# Patient Record
Sex: Female | Born: 1985 | Race: White | Hispanic: No | Marital: Single
Health system: Southern US, Community
[De-identification: ages and names within clinical notes are randomized; demographics above are authoritative.]

---

## 2008-12-28 ENCOUNTER — Emergency Department (HOSPITAL_BASED_OUTPATIENT_CLINIC_OR_DEPARTMENT_OTHER): Admission: EM | Admit: 2008-12-28 | Discharge: 2008-12-28 | Payer: Self-pay | Admitting: Emergency Medicine

## 2008-12-30 ENCOUNTER — Emergency Department (HOSPITAL_BASED_OUTPATIENT_CLINIC_OR_DEPARTMENT_OTHER): Admission: EM | Admit: 2008-12-30 | Discharge: 2008-12-30 | Payer: Self-pay | Admitting: Emergency Medicine

## 2009-01-19 ENCOUNTER — Ambulatory Visit (HOSPITAL_COMMUNITY): Admission: RE | Admit: 2009-01-19 | Discharge: 2009-01-19 | Payer: Self-pay | Admitting: Internal Medicine

## 2009-05-30 ENCOUNTER — Emergency Department (HOSPITAL_BASED_OUTPATIENT_CLINIC_OR_DEPARTMENT_OTHER): Admission: EM | Admit: 2009-05-30 | Discharge: 2009-05-30 | Payer: Self-pay | Admitting: Emergency Medicine

## 2010-07-26 IMAGING — CT CT ABDOMEN W/ CM
2 of 7 series · 15 of 46 positions shown, 19 images · IV contrast (APPLIED)
Comparison: None

CT ABDOMEN

CLINICAL DATA: Right upper abdominal pain, nausea, vomiting and
diarrhea.

CT ABDOMEN AND PELVIS WITH CONTRAST
TECHNIQUE: Multidetector CT imaging of the abdomen and pelvis was
performed using the standard protocol following bolus
administration of intravenous contrast.
Contrast: 100 ml Wmnipaque-1LL IV

[Series 2: abd/pelv with 5.0 b31f st · axial · 0.74mm/px · z∈[+872,+1312]mm · 12 of 103 slices shown, 16 images]
[im 10/103  soft-tissue]
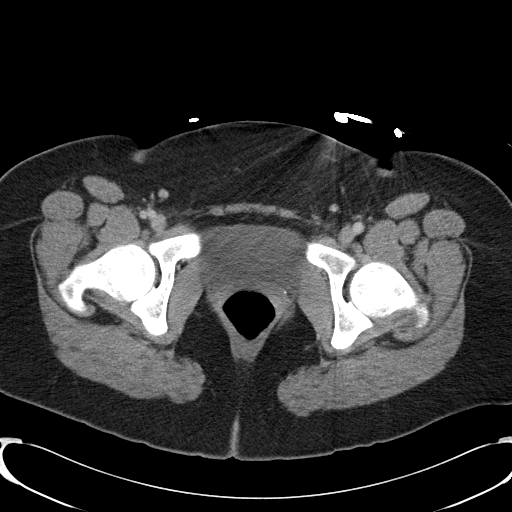
[im 10/103  bone]
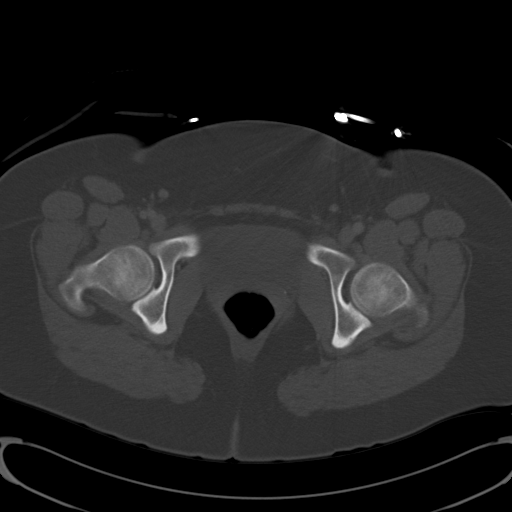
[im 19/103  soft-tissue]
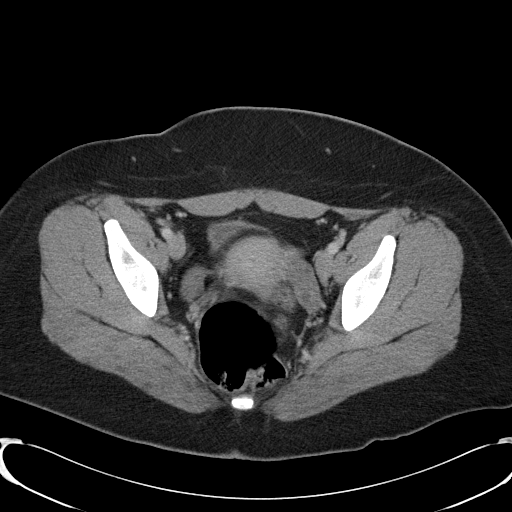
[im 28/103  soft-tissue]
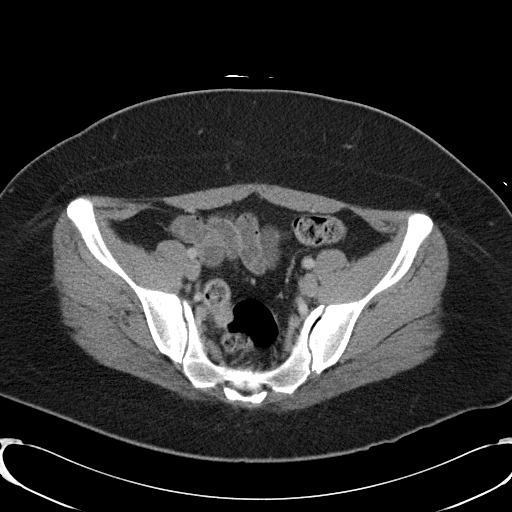
[im 38/103  soft-tissue]
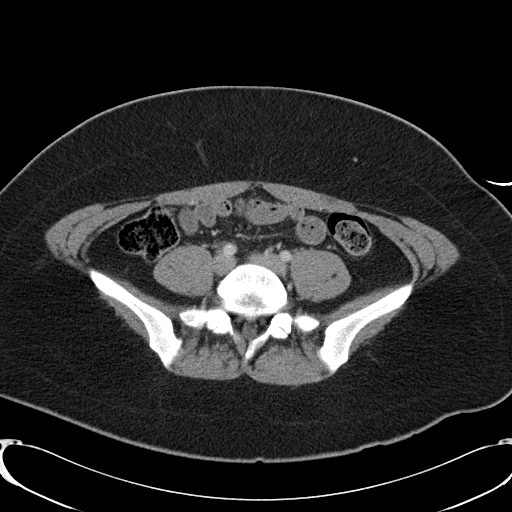
[im 47/103  soft-tissue]
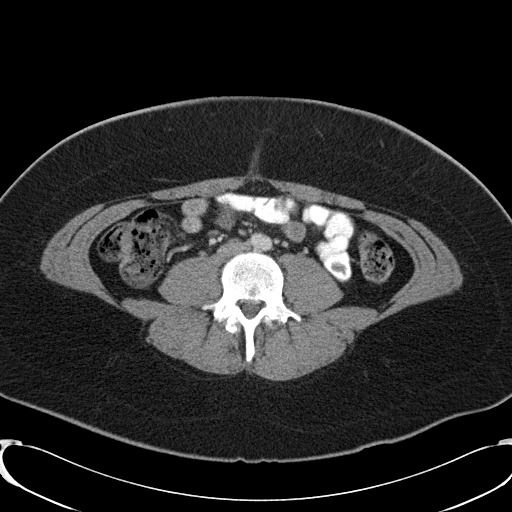
[im 56/103  soft-tissue]
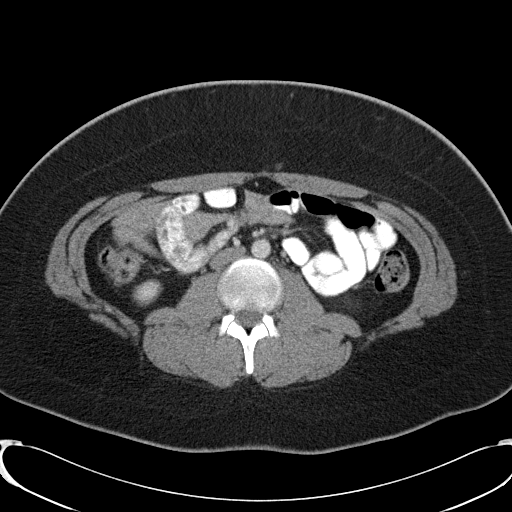
[im 65/103  soft-tissue]
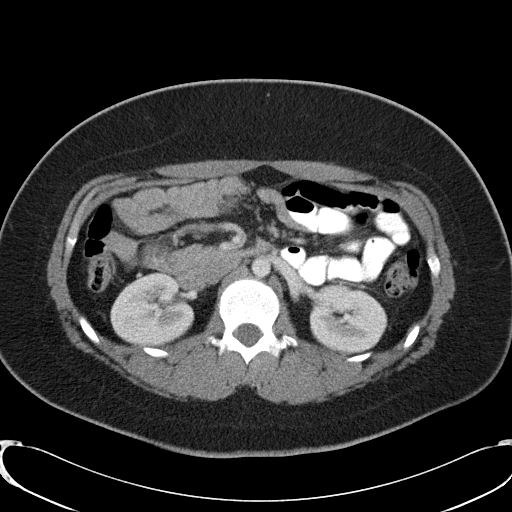
[im 75/103  soft-tissue]
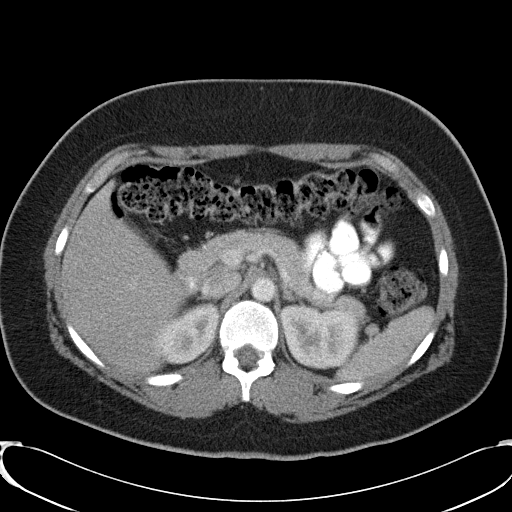
[im 84/103  soft-tissue]
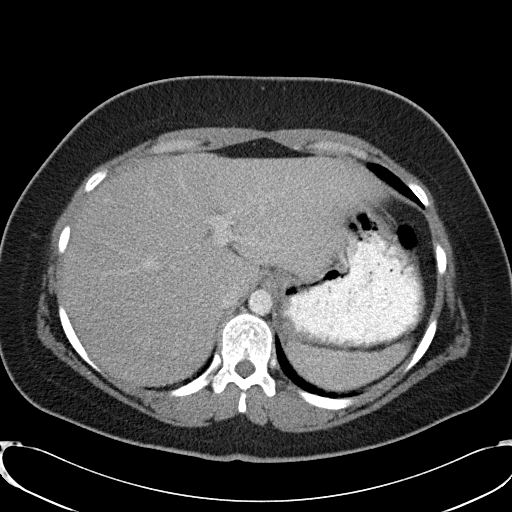
[im 84/103  lung]
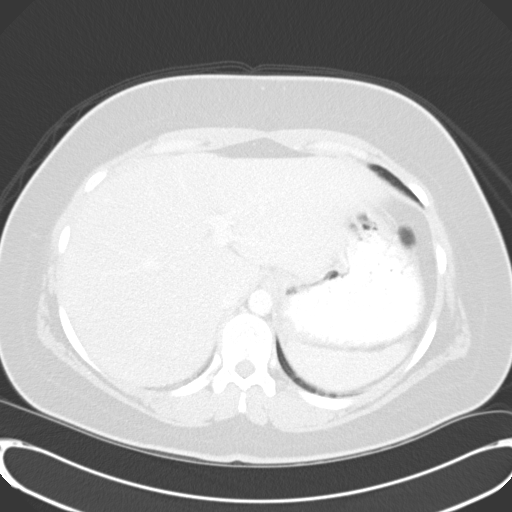
[im 84/103  bone]
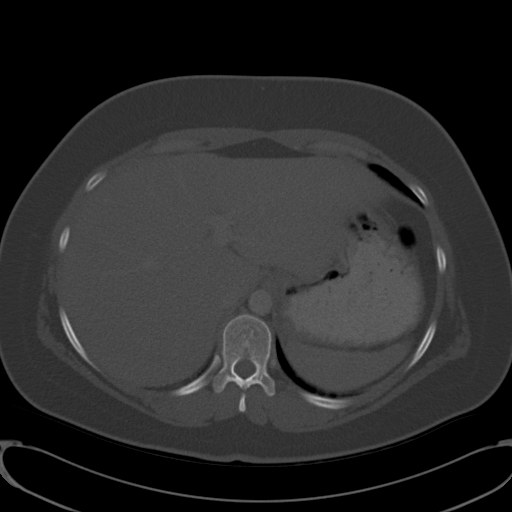
[im 89/103  lung]
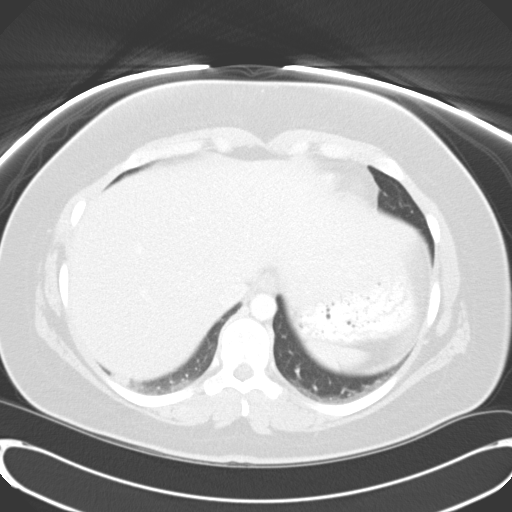
[im 93/103  soft-tissue]
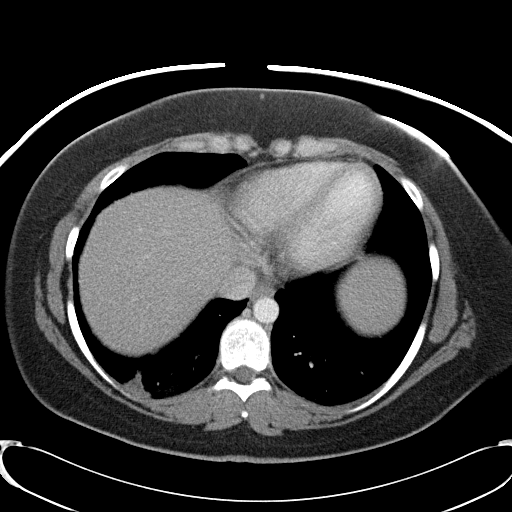
[im 93/103  lung]
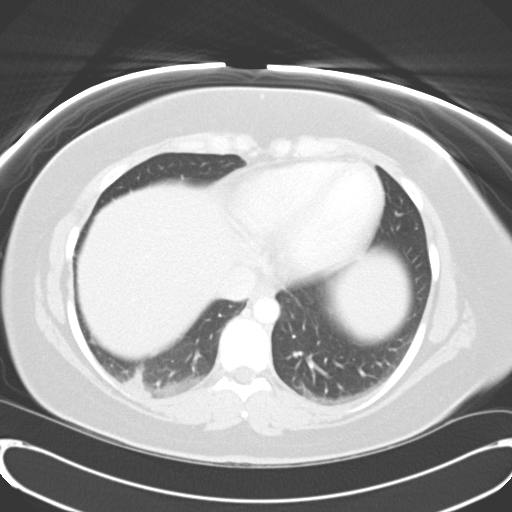
[im 98/103  lung]
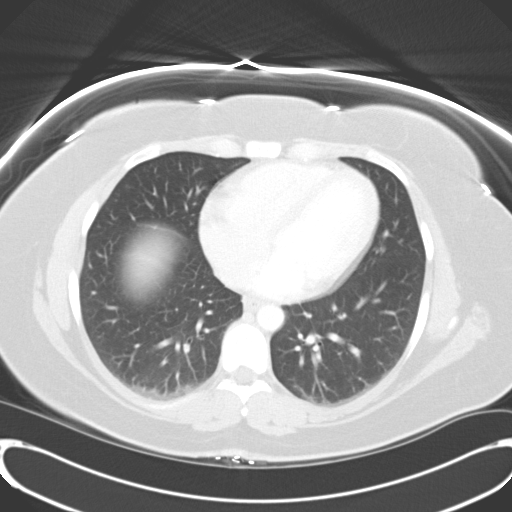

[Series 602: coronal · coronal · 1.00mm/px · 3 of 103 slices shown]
[im 26/103  soft-tissue]
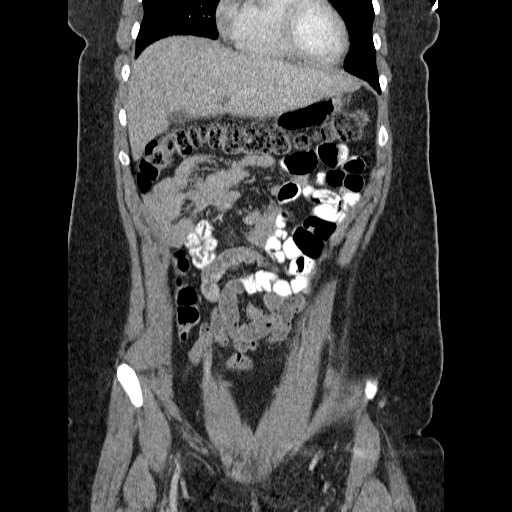
[im 52/103  soft-tissue]
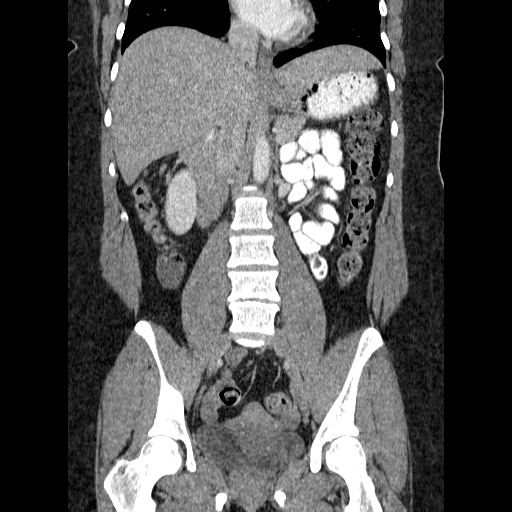
[im 77/103  soft-tissue]
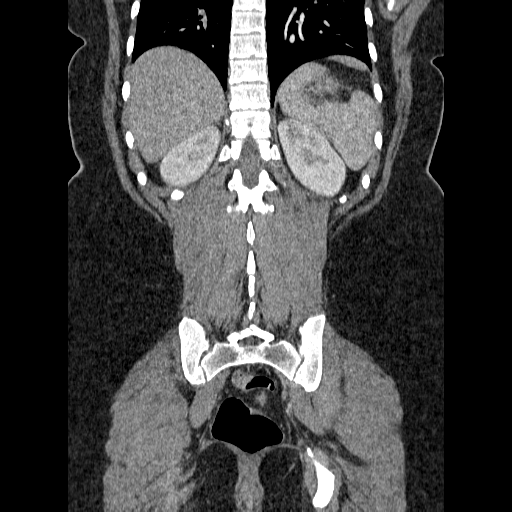

[15 of 46 positions shown; findings below may reference images not displayed]

FINDINGS: The visualized lung bases demonstrate mild atelectasis
at the posterior right lung base.  The liver, gallbladder,
pancreas, spleen, adrenal glands and kidneys are all within normal
limits.

Abdominal bowel loops are normal caliber.  No evidence of
inflammatory process or fluid collection.  No hernias.  No abnormal
calcifications.  The bony structures are unremarkable.
IMPRESSION: Normal CT of abdomen.

CT PELVIS
FINDINGS: Pelvic bowel loops are normal caliber.  The bladder,
uterus, and adnexal regions are unremarkable by CT.  No hernias.
No abnormal calcifications.  The bony pelvis is unremarkable.
IMPRESSION: Normal CT of pelvis.

## 2011-02-16 LAB — URINALYSIS, ROUTINE W REFLEX MICROSCOPIC
Bilirubin Urine: NEGATIVE
Hgb urine dipstick: NEGATIVE
Specific Gravity, Urine: 1.008 (ref 1.005–1.030)
pH: 6 (ref 5.0–8.0)

## 2011-02-16 LAB — POCT TOXICOLOGY PANEL: Amphetamines: POSITIVE

## 2011-02-25 LAB — COMPREHENSIVE METABOLIC PANEL
AST: 30 U/L (ref 0–37)
AST: 33 U/L (ref 0–37)
Albumin: 4.1 g/dL (ref 3.5–5.2)
BUN: 11 mg/dL (ref 6–23)
BUN: 8 mg/dL (ref 6–23)
CO2: 25 mEq/L (ref 19–32)
CO2: 26 mEq/L (ref 19–32)
Calcium: 9.2 mg/dL (ref 8.4–10.5)
Creatinine, Ser: 0.7 mg/dL (ref 0.4–1.2)
GFR calc Af Amer: >60 mL/min (ref >60)
GFR calc non Af Amer: >60 mL/min (ref >60)
Glucose, Bld: 96 mg/dL (ref 70–99)
Total Bilirubin: 0.9 mg/dL (ref 0.3–1.2)

## 2011-02-25 LAB — URINALYSIS, ROUTINE W REFLEX MICROSCOPIC
Bilirubin Urine: NEGATIVE
Hgb urine dipstick: NEGATIVE
Ketones, ur: 40 mg/dL — AB
Leukocytes, UA: NEGATIVE
Nitrite: NEGATIVE
Nitrite: NEGATIVE
Protein, ur: 30 mg/dL — AB
Specific Gravity, Urine: 1.012 (ref 1.005–1.030)
Urobilinogen, UA: 0.2 mg/dL (ref 0.0–1.0)
Urobilinogen, UA: 0.2 mg/dL (ref 0.0–1.0)
pH: 7.5 (ref 5.0–8.0)

## 2011-02-25 LAB — DIFFERENTIAL
Basophils Absolute: 0.1 10*3/uL (ref 0.0–0.1)
Eosinophils Relative: 1 % (ref 0–5)
Lymphocytes Relative: 24 % (ref 12–46)
Monocytes Absolute: 0.6 10*3/uL (ref 0.1–1.0)

## 2011-02-25 LAB — CBC
HCT: 41.6 % (ref 36.0–46.0)
Hemoglobin: 14 g/dL (ref 12.0–15.0)
MCHC: 33.7 g/dL (ref 30.0–36.0)
MCHC: 33.8 g/dL (ref 30.0–36.0)
MCV: 91.4 fL (ref 78.0–100.0)
MCV: 92.5 fL (ref 78.0–100.0)
Platelets: 332 10*3/uL (ref 150–400)
RBC: 4.55 MIL/uL (ref 3.87–5.11)
RBC: 4.91 MIL/uL (ref 3.87–5.11)
RDW: 12.7 % (ref 11.5–15.5)

## 2011-02-25 LAB — PREGNANCY, URINE: Preg Test, Ur: NEGATIVE

## 2015-08-13 ENCOUNTER — Ambulatory Visit (INDEPENDENT_AMBULATORY_CARE_PROVIDER_SITE_OTHER): Payer: BLUE CROSS/BLUE SHIELD | Admitting: Rehabilitative and Restorative Service Providers"

## 2015-08-13 DIAGNOSIS — M623 Immobility syndrome (paraplegic): Secondary | ICD-10-CM

## 2015-08-13 DIAGNOSIS — R531 Weakness: Secondary | ICD-10-CM

## 2015-08-13 DIAGNOSIS — Z7409 Other reduced mobility: Secondary | ICD-10-CM

## 2015-08-13 DIAGNOSIS — M5441 Lumbago with sciatica, right side: Secondary | ICD-10-CM

## 2015-08-13 DIAGNOSIS — R29898 Other symptoms and signs involving the musculoskeletal system: Secondary | ICD-10-CM | POA: Diagnosis not present

## 2015-08-13 DIAGNOSIS — M2560 Stiffness of unspecified joint, not elsewhere classified: Secondary | ICD-10-CM

## 2015-08-13 DIAGNOSIS — R6889 Other general symptoms and signs: Secondary | ICD-10-CM

## 2015-08-13 NOTE — Patient Instructions (Signed)
Prone press - salute to sun  To to tolerance - no pain Pause 1-2 sec  Repeat 10  Squeeze buttocks tight lying on firm surface on belly  Hold 5-10 sec  10 reps    3 part core -  Tighten pelvic floor; suck belly button to back bone; tighten muscles in back at waist Hold 10 sec 10 reps  Start with this on back progress to do this sitting standing and walking as well as with functional activities  Myofacial ball release work

## 2015-08-13 NOTE — Therapy (Signed)
[facility] [place] [address] [place], [place], [address] Phone: [phone]   Fax:  [phone]  Physical Therapy Evaluation  Patient Details  Name: Kimberly Johnson MRN: [id number] Date of Birth: [birth year] Referring Provider:  Fredric Dine, MD  Encounter Date: 08/13/2015      PT End of Session - 08/13/15 1050    Visit Number 1   Number of Visits 12   Date for PT Re-Evaluation 09/24/15   PT Start Time 0948   PT Stop Time 1055   PT Time Calculation (min) 67 min   Activity Tolerance Patient tolerated treatment well      No past medical history on file.  No past surgical history on file.  There were no vitals filed for this visit.  Visit Diagnosis:  Right-sided low back pain with right-sided sciatica - Plan: PT plan of care cert/re-cert  Weakness of right leg - Plan: PT plan of care cert/re-cert  Stiffness due to immobility - Plan: PT plan of care cert/re-cert  Decreased strength, endurance, and mobility - Plan: PT plan of care cert/re-cert      Subjective Assessment - 08/13/15 0852    Subjective Patient reports long history of back pain and dysfunction. Underwent disectomy L3/4  06/06/15 due to weakness and numbness Rt LE and limited functional abilities. Cont pain and decreased function Rt LE - leg does not feel "normal".    Pertinent History LBP 2004/2005 - disc surgery L4/5 05/2004 - cervical disc surgery with fusiioin C2/3 10/15 - gall baldder 01/07/13; breast reduction 10/31/07; migraines   How long can you sit comfortably? 30 min    How long can you stand comfortably? 1 hour   How long can you walk comfortably? 10 min  - leg starts hurting muscles in leg are tightening   Diagnostic tests MRI - DDD/spinal stenosis lumbar spine   Patient Stated Goals to be able to walk again daily; stand and sit comfortably    Currently in Pain? Yes   Pain Score 7    Pain Location Back   Pain Orientation Lower;Right   Pain  Descriptors / Indicators Aching;Throbbing   Pain Radiating Towards down posterior Rt thigh to bottom of foot   Pain Onset More than a month ago   Pain Frequency Intermittent   Aggravating Factors  sitting >30 min; standing > 30 min: walking >10 min; reacing; bending; twisting; lifting; ascending/descending stairs   Pain Relieving Factors danskos; lying supine with knees elevated            [facility] PT Assessment - 08/13/15 0001    Assessment   Medical Diagnosis LBP   Onset Date/Surgical Date 06/06/15   Hand Dominance Right   Next MD Visit 11/22/15   Prior Therapy yes - 06/15 for neck surgery    Precautions   Precautions Other (comment)   Precaution Comments no lifting > 3-5 pounds; no bending/lifting   Balance Screen   Has the patient fallen in the past 6 months Yes   How many times? 12  due to weanding in Rt LE    Has the patient had a decrease in activity level because of a fear of falling?  Yes   Is the patient reluctant to leave their home because of a fear of falling?  No   Home Environment   Additional Comments some pain with ascending/descending stairs using railing    Prior Function   Level of Independence Independent   Vocation Full time employment  Vocation Requirements desk/computer 8 hr/5 days/wk    Leisure was walking 5-6 x/wk for 45-60 min no t walking since surgery - light household chores prior to surgery not now   Observation/Other Assessments   Focus on Therapeutic Outcomes (FOTO)  69% limitation   Sensation   Additional Comments numbness medial anterior leg knee to ankle    Posture/Postural Control   Posture Comments head forward; increased thoracic kyphosis;Lt knee hyperextended; arms in IR at sides   AROM   Lumbar Flexion 20%  pain   Lumbar Extension 55%   Lumbar - Right Side Bend 65%  pain Lt lateral sacral area   Lumbar - Left Side Bend 45%  pain Rt LB   Lumbar - Right Rotation 45%  pain Rt LB   Lumbar - Left Rotation 40%  pulling Rt LB    Strength   Right Hip Flexion 4+/5  LBP   Right Hip Extension 4+/5  LBP   Right Hip ABduction 4+/5   Right Hip ADduction --  5-/5   Right/Left Knee --  WNL's except Rt knee extension 4+/5 painful   Flexibility   Hamstrings Lt 87 deg; 57 deg    Quadriceps Lt 8" Rt 9" heel to buttocks   ITB tight Rt >> LT   Piriformis Rt tight >> LT   Palpation   Palpation comment muscular tightness through Lumbar spine; paraspinals; Rt QL/piriformis/hip abductors    Special Tests    Special Tests --  SLR LBP radicular Rt LE pain post thigh 57 degrees   Bed Mobility   Bed Mobility --  poor body mechanins with transitional mvts/transfers   Ambulation/Gait   Gait Comments Slight decrease in weight bearing phase Rt LE; LE's ER in stance and with gait                   [name] - 08/13/15 0001    Self-Care   Self-Care --  initial education re spine care/movement   Lumbar Exercises: Stretches   Press Ups --  8 reps 2-3 sec hold    Lumbar Exercises: Standing   Other Standing Lumbar Exercises myofacial ball release work   Lumbar Exercises: Supine   AB Set Limitations 3 part core difficulty with multifidi contraction 10 reps 10 sec hold   Glut Set 10 reps;5 seconds  prone   Cryotherapy   Number Minutes Cryotherapy 15 Minutes   Cryotherapy Location Lumbar Spine   Type of Cryotherapy Ice pack   Electrical Stimulation   Electrical Stimulation Location lumbar - Rt hip   Electrical Stimulation Action IFC   Electrical Stimulation Parameters to tolerance   Electrical Stimulation Goals Pain                     PT Long Term Goals - 08/13/15 1058    PT LONG TERM GOAL #1   Title Patient I in HEP 09/24/15   Time 6   Period Weeks   Status New   PT LONG TERM GOAL #2   Title Patient to tolerate sitting for 30-60 min 09/24/15   Time 6   Period Weeks   Status New   PT LONG TERM GOAL #3   Title Rt LE strength 5-/5 to 5/5 throughout 09/24/15   Time 6    Period Weeks   Status New   PT LONG TERM GOAL #4   Title Improve walking tolerance to 30-45 min 09/24/15   Time 6   Period Weeks  Status New   PT LONG TERM GOAL #5   Title Improve FOTO to </= 48% limitation 09/24/15   Time 6   Period Weeks   Status New               Plan - 08/13/15 1050    Clinical Impression Statement Patient presents with continued pain, limited spinal and LE mobility, Rt LE weakness, limited sitting/standing/walking tolerance; limited functional activity level and endurance following lumbar disc surgery 06/06/15. She will benefit from Physicla Therapy to address problems identified and return patient to normal funcitonal activity level. Spine care; ergonimics and core stabilization will be included in her rehab course.    Pt will benefit from skilled therapeutic intervention in order to improve on the following deficits Pain;Decreased range of motion;Decreased strength;Improper body mechanics;Abnormal gait;Decreased endurance;Decreased activity tolerance;Obesity   Rehab Potential Good   PT Frequency 2x / week   PT Duration 6 weeks   PT Treatment/Interventions Patient/family education;ADLs/Self Care Home Management;Manual techniques;Therapeutic exercise;Therapeutic activities;Cryotherapy;Electrical Stimulation;Moist Heat;Ultrasound;Dry needling   PT Next Visit Plan Review HEP; continue spine care education; body mechanics; proper transfers and transitional movements; add neural mobilization Rt LE into SLR position to tolerance of nerve symptoms; progress with stretching through tight hip and back musculature; progress with core stabilization.    PT Home Exercise Plan HEP; myofacial ball release work; discussed TENS unit and use of ice for home pain management/provided info for TENS unit purchase.    Consulted and Agree with Plan of Care Patient         Problem List There are no active problems to display for this patient.   Celyn Rober Minion PT,  MPH 08/13/2015, 11:05 AM  [facility] [address] [place], [place], [address] Phone: [phone]   Fax:  [phone]

## 2015-08-15 ENCOUNTER — Ambulatory Visit (INDEPENDENT_AMBULATORY_CARE_PROVIDER_SITE_OTHER): Payer: BLUE CROSS/BLUE SHIELD | Admitting: Physical Therapy

## 2015-08-15 DIAGNOSIS — M623 Immobility syndrome (paraplegic): Secondary | ICD-10-CM | POA: Diagnosis not present

## 2015-08-15 DIAGNOSIS — Z7409 Other reduced mobility: Secondary | ICD-10-CM

## 2015-08-15 DIAGNOSIS — M2560 Stiffness of unspecified joint, not elsewhere classified: Secondary | ICD-10-CM

## 2015-08-15 DIAGNOSIS — M5441 Lumbago with sciatica, right side: Secondary | ICD-10-CM

## 2015-08-15 DIAGNOSIS — R29898 Other symptoms and signs involving the musculoskeletal system: Secondary | ICD-10-CM

## 2015-08-15 DIAGNOSIS — R531 Weakness: Secondary | ICD-10-CM

## 2015-08-15 NOTE — Therapy (Signed)
[facility] [place] [address] [place], [place], [address] Phone: [phone]   Fax:  [phone]  Physical Therapy Treatment  Patient Details  Name: Kimberly Johnson MRN: [id number] Date of Birth: 1986-10-19 Referring Provider:  Fredric Dine, MD  Encounter Date: 08/15/2015      PT End of Session - 08/15/15 0808    Visit Number 2   Number of Visits 12   Date for PT Re-Evaluation 09/24/15   PT Start Time 0808   PT Stop Time 0857   PT Time Calculation (min) 49 min   Activity Tolerance Patient tolerated treatment well      No past medical history on file.  No past surgical history on file.  There were no vitals filed for this visit.  Visit Diagnosis:  No diagnosis found.      Subjective Assessment - 08/15/15 0809    Subjective Pt reports the stretches have helped. She walked 5 min on treadmill and stretched, "the results were amazing"- the back pain decreased significantly 8/10 to 3/10.    Currently in Pain? Yes   Pain Score 7    Pain Location Back   Pain Orientation Right   Pain Descriptors / Indicators Contraction;Tightness   Pain Radiating Towards down posterior Rt thigh to bottom of foot.             [facility] PT Assessment - 08/15/15 0001    Assessment   Medical Diagnosis LBP   Onset Date/Surgical Date 06/06/15   Hand Dominance Right   Next MD Visit 11/22/15          [facility] Adult PT Treatment/Exercise - 08/15/15 0001    Transfers   Comments Pt instructed on core engagement with sit to/from stand.  Pt returned demo x 8 reps (also with staggered stance).  Pt re-educated on sit to/from supine; returned demo x 2 reps    Lumbar Exercises: Stretches   Passive Hamstring Stretch 3 reps;30 seconds   Passive Hamstring Stretch Limitations (supine with strap)   Press Ups 5 reps  5 sec hold    ITB Stretch 1 rep;60 seconds   ITB Stretch Limitations (with strap in supine)   Lumbar Exercises: Aerobic   Stationary  Bike NuStep L3: 5 min    Lumbar Exercises: Supine   Ab Set 5 reps;5 seconds   Lumbar Exercises: Prone   Other Prone Lumbar Exercises Gluteal squeeze x 5 sec hold x 10 reps   Cryotherapy   Number Minutes Cryotherapy 15 Minutes   Cryotherapy Location Lumbar Spine   Type of Cryotherapy Ice pack   Electrical Stimulation   Electrical Stimulation Location Rt lumbar /SI   Electrical Stimulation Action premod   Electrical Stimulation Parameters to tolerance x 15 min    Electrical Stimulation Goals Pain                PT Education - 08/15/15 0844    Education provided Yes   Education Details HEP- added supine hamstring stretch   Person(s) Educated Patient   Methods Handout;Explanation   Comprehension Verbalized understanding;Returned demonstration             PT Long Term Goals - 08/13/15 1058    PT LONG TERM GOAL #1   Title Patient I in HEP 09/24/15   Time 6   Period Weeks   Status New   PT LONG TERM GOAL #2   Title Patient to tolerate sitting for 30-60 min 09/24/15   Time 6   Period  Weeks   Status New   PT LONG TERM GOAL #3   Title Rt LE strength 5-/5 to 5/5 throughout 09/24/15   Time 6   Period Weeks   Status New   PT LONG TERM GOAL #4   Title Improve walking tolerance to 30-45 min 09/24/15   Time 6   Period Weeks   Status New   PT LONG TERM GOAL #5   Title Improve FOTO to </= 48% limitation 09/24/15   Time 6   Period Weeks   Status New               Plan - 08/15/15 0933    Clinical Impression Statement Pt tolerated all exercises without increase in back pain.  Pt able to return demo of proper body mechanics with sit to/from stand, sit to supine. Pt demo some difficulty with transverse abdominal engagement.  Pt reported decrease of pain to 3-4/10 at end of session.    Pt will benefit from skilled therapeutic intervention in order to improve on the following deficits Pain;Decreased range of motion;Decreased strength;Improper body  mechanics;Abnormal gait;Decreased endurance;Decreased activity tolerance;Obesity   Rehab Potential Good   PT Frequency 2x / week   PT Duration 6 weeks   PT Treatment/Interventions Patient/family education;ADLs/Self Care Home Management;Manual techniques;Therapeutic exercise;Therapeutic activities;Cryotherapy;Electrical Stimulation;Moist Heat;Ultrasound;Dry needling   PT Next Visit Plan Review HEP; continue spine care education; body mechanics; proper transfers and transitional movements; add neural mobilization Rt LE into SLR position to tolerance of nerve symptoms; progress with stretching through tight hip and back musculature; progress with core stabilization.         Problem List There are no active problems to display for this patient.  [name], PTA 08/15/2015 10:13 AM   [facility] [address] [place], [place], [address] Phone: [phone]   Fax:  [phone]

## 2015-08-20 ENCOUNTER — Ambulatory Visit (INDEPENDENT_AMBULATORY_CARE_PROVIDER_SITE_OTHER): Payer: BLUE CROSS/BLUE SHIELD | Admitting: Physical Therapy

## 2015-08-20 DIAGNOSIS — M623 Immobility syndrome (paraplegic): Secondary | ICD-10-CM

## 2015-08-20 DIAGNOSIS — M5441 Lumbago with sciatica, right side: Secondary | ICD-10-CM

## 2015-08-20 DIAGNOSIS — R29898 Other symptoms and signs involving the musculoskeletal system: Secondary | ICD-10-CM

## 2015-08-20 DIAGNOSIS — R531 Weakness: Secondary | ICD-10-CM

## 2015-08-20 DIAGNOSIS — M2560 Stiffness of unspecified joint, not elsewhere classified: Secondary | ICD-10-CM

## 2015-08-20 DIAGNOSIS — Z7409 Other reduced mobility: Secondary | ICD-10-CM | POA: Diagnosis not present

## 2015-08-20 DIAGNOSIS — R6889 Other general symptoms and signs: Secondary | ICD-10-CM

## 2015-08-20 NOTE — Therapy (Signed)
[name] [address] [place], [place], [address] Phone: [phone]   Fax:  [phone]  Physical Therapy Treatment  Patient Details  Name: Kimberly Johnson MRN: [id number] Date of Birth: Dec 02, 1985 Referring Provider:  Fredric Dine, MD  Encounter Date: 08/20/2015      PT End of Session - 08/20/15 0809    Visit Number 3   Number of Visits 12   Date for PT Re-Evaluation 09/24/15   PT Start Time 0805   PT Stop Time 0859   PT Time Calculation (min) 54 min      No past medical history on file.  No past surgical history on file.  There were no vitals filed for this visit.  Visit Diagnosis:  Right-sided low back pain with right-sided sciatica  Weakness of right leg  Stiffness due to immobility  Decreased strength, endurance, and mobility      Subjective Assessment - 08/20/15 0807    Subjective "I don't think I was doing the hamstring stretches right because it was hurting more".  Pt reports she is getting relief from prone press ups; has been performing several times per day.    Currently in Pain? Yes   Pain Score 7    Pain Location Back   Pain Orientation Right   Pain Descriptors / Indicators Sharp;Tightness   Aggravating Factors  Prolonged sitting, standing    Pain Relieving Factors prone press ups.             [name] PT Assessment - 08/20/15 0001    Assessment   Medical Diagnosis LBP   Onset Date/Surgical Date 06/06/15   Hand Dominance Right   Strength   Right/Left Hip Right   Right Hip Flexion 4+/5  LBP   Right Hip Extension --  5-/5   Right Hip ABduction --  5-/5                     [name] - 08/20/15 0001    Lumbar Exercises: Stretches   Passive Hamstring Stretch 3 reps;30 seconds   Press Ups --  8 reps, 5 sec hold   ITB Stretch 2 reps;1 rep;30 seconds   ITB Stretch Limitations with strap; increased pain- stopped.    Piriformis Stretch 60  seconds;2 reps   Lumbar Exercises: Aerobic   Stationary Bike NuStep L3: 5 min    Lumbar Exercises: Supine   Ab Set 5 reps;5 seconds   Clam 5 reps  each leg with ab set   Bent Knee Raise 10 reps  with ab set   Lumbar Exercises: Sidelying   Clam 10 reps  2 sets RLE   Lumbar Exercises: Prone   Straight Leg Raise 10 reps   Cryotherapy   Number Minutes Cryotherapy 15 Minutes   Cryotherapy Location Lumbar Spine   Type of Cryotherapy Ice pack   Electrical Stimulation   Electrical Stimulation Location Rt lumbar paraspinals/ piriformis   Electrical Stimulation Action premod to each area   Electrical Stimulation Parameters to tolerance x 15 min   Electrical Stimulation Goals Pain                PT Education - 08/20/15 0846    Education provided Yes   Education Details HEP, added clam, Hip ext    Person(s) Educated Patient   Methods Handout;Explanation;Demonstration   Comprehension Returned demonstration;Verbalized understanding             PT Long Term Goals - 08/13/15  1058    PT LONG TERM GOAL #1   Title Patient I in HEP 09/24/15   Time 6   Period Weeks   Status New   PT LONG TERM GOAL #2   Title Patient to tolerate sitting for 30-60 min 09/24/15   Time 6   Period Weeks   Status New   PT LONG TERM GOAL #3   Title Rt LE strength 5-/5 to 5/5 throughout 09/24/15   Time 6   Period Weeks   Status New   PT LONG TERM GOAL #4   Title Improve walking tolerance to 30-45 min 09/24/15   Time 6   Period Weeks   Status New   PT LONG TERM GOAL #5   Title Improve FOTO to </= 48% limitation 09/24/15   Time 6   Period Weeks   Status New               Plan - 08/20/15 0831    Clinical Impression Statement Pt is reporting overall decreased pain level; no longer taking pain medicine. Pt continues to require some cues for proper breathing and form with core exercise.Pt reporting increased pain with supine ITB stretch; will d/c this stretch and trial alternate  position next visit.  Pt progressing towards goals.    Pt will benefit from skilled therapeutic intervention in order to improve on the following deficits Pain;Decreased range of motion;Decreased strength;Improper body mechanics;Abnormal gait;Decreased endurance;Decreased activity tolerance;Obesity   Rehab Potential Good   PT Frequency 2x / week   PT Duration 6 weeks   PT Treatment/Interventions Patient/family education;ADLs/Self Care Home Management;Manual techniques;Therapeutic exercise;Therapeutic activities;Cryotherapy;Electrical Stimulation;Moist Heat;Ultrasound;Dry needling   PT Next Visit Plan Continue core strengthening/ LE stretching/ extension program. Trial alternate ITB stretch.    Consulted and Agree with Plan of Care Patient        Problem List There are no active problems to display for this patient.  [name], PTA 08/20/2015 9:55 AM  [facility] [address] [place] [address] [place], [place], [address] Phone: [phone]   Fax:  [phone]

## 2015-08-20 NOTE — Patient Instructions (Addendum)
Straight Leg Raise (Prone)    Abdomen and head supported, keep left knee locked and raise leg at hip. Avoid arching low back. Repeat __5-10_ times per set. Do __1-2__ sets per session. Do __1__ sessions per day. .  Abduction: Clam (Eccentric) - Side-Lying    Lie on side with knees bent. Lift top knee, keeping feet together. Keep trunk steady. Slowly lower for 3-5 seconds. __10_ reps per set, _2__ sets per day, ___ days per week.   [facility] [address] [place], [place] [address]  [phone] (office) [phone] (fax)

## 2015-08-23 ENCOUNTER — Ambulatory Visit (INDEPENDENT_AMBULATORY_CARE_PROVIDER_SITE_OTHER): Payer: BLUE CROSS/BLUE SHIELD | Admitting: Physical Therapy

## 2015-08-23 DIAGNOSIS — R6889 Other general symptoms and signs: Secondary | ICD-10-CM

## 2015-08-23 DIAGNOSIS — M623 Immobility syndrome (paraplegic): Secondary | ICD-10-CM | POA: Diagnosis not present

## 2015-08-23 DIAGNOSIS — Z7409 Other reduced mobility: Secondary | ICD-10-CM | POA: Diagnosis not present

## 2015-08-23 DIAGNOSIS — M5441 Lumbago with sciatica, right side: Secondary | ICD-10-CM

## 2015-08-23 DIAGNOSIS — R531 Weakness: Secondary | ICD-10-CM

## 2015-08-23 DIAGNOSIS — M2560 Stiffness of unspecified joint, not elsewhere classified: Secondary | ICD-10-CM

## 2015-08-23 DIAGNOSIS — R29898 Other symptoms and signs involving the musculoskeletal system: Secondary | ICD-10-CM | POA: Diagnosis not present

## 2015-08-23 NOTE — Therapy (Signed)
[facility] [place] [address] [place], [place], [address] Phone: [phone]   Fax:  [phone]  Physical Therapy Treatment  Patient Details  Name: Kimberly Johnson MRN: [id number] Date of Birth: [birth year] Referring Provider:  Fredric Dine, MD  Encounter Date: 08/23/2015      PT End of Session - 08/23/15 0812    Visit Number 4   Number of Visits 12   Date for PT Re-Evaluation 09/24/15   PT Start Time 0805   PT Stop Time 0858   PT Time Calculation (min) 53 min   Activity Tolerance Patient tolerated treatment well      No past medical history on file.  No past surgical history on file.  There were no vitals filed for this visit.  Visit Diagnosis:  Right-sided low back pain with right-sided sciatica  Weakness of right leg  Stiffness due to immobility  Decreased strength, endurance, and mobility      Subjective Assessment - 08/23/15 0854    Subjective Pt reports she over did it in the afternoon of last therapy session and has been "paying for it since".  Noted increased back pain; only performed LB stretches and took it easy in hopes to calm pain down. Pt will be getting a stand up desk for work soon.  "I'm only able to sit ~15 min  prior to getting stiff and uncomfortable"    Currently in Pain? Yes   Pain Score 6    Pain Location Back   Pain Orientation Right;Lower   Pain Descriptors / Indicators Tightness;Sore   Aggravating Factors  prolonged sitting, standing   Pain Relieving Factors prone press ups             [facility] PT Assessment - 08/23/15 0001    Assessment   Medical Diagnosis LBP   Onset Date/Surgical Date 06/06/15   Hand Dominance Right   Next MD Visit 11/22/15            [facility] Adult PT Treatment/Exercise - 08/23/15 0001    Transfers   Comments Pt educated on transfer from standing down to/from floor.  Pt required demo and VC.  Pt able to return demo 2x with improved technique 2nd  attempt.  Pt instructed on how to apply stretch strap to foot prior to bed mobility to maintain back precautions; pt returned demo.    Lumbar Exercises: Stretches   Passive Hamstring Stretch 3 reps;30 seconds   Passive Hamstring Stretch Limitations Seated with straight back   Press Ups 5 reps  5 sec hold   Quad Stretch 2 reps;60 seconds  (prone with strap), each leg   Lumbar Exercises: Aerobic   Stationary Bike NuStep L4: 5 min    Lumbar Exercises: Standing   Other Standing Lumbar Exercises HIGH KNEELING:  this position (on 3" foam pad for knee comfort) to single knee with opposite leg flexed/foot on table - 5 reps each side with core engagement and no UE support.  High kneeling with ball toss (core engaged) ~1 min    Lumbar Exercises: Supine   Ab Set 5 reps;5 seconds   Lumbar Exercises: Quadruped   Madcat/Old Horse 5 reps   Modalities   Modalities Electrical Stimulation;Cryotherapy   Cryotherapy   Number Minutes Cryotherapy 15 Minutes   Cryotherapy Location Lumbar Spine   Type of Cryotherapy Ice pack   Electrical Stimulation   Electrical Stimulation Location bilat lumbar paraspinals   Electrical Stimulation Action IFC   Electrical Stimulation Parameters to tolerance  Electrical Stimulation Goals Pain                     PT Long Term Goals - 08/23/15 0901    PT LONG TERM GOAL #1   Title Patient I in HEP 09/24/15   Time 6   Period Weeks   Status On-going   PT LONG TERM GOAL #2   Title Patient to tolerate sitting for 30-60 min 09/24/15   Time 6   Period Weeks   Status On-going   PT LONG TERM GOAL #3   Title Rt LE strength 5-/5 to 5/5 throughout 09/24/15   Time 6   Period Weeks   Status On-going   PT LONG TERM GOAL #4   Title Improve walking tolerance to 30-45 min 09/24/15   Time 6   Period Weeks   Status On-going   PT LONG TERM GOAL #5   Title Improve FOTO to </= 48% limitation 09/24/15   Time 6   Period Weeks   Status On-going                Plan - 08/23/15 0849    Clinical Impression Statement Pt reported a 3 point decrease in pain with therapeutic exercise today; further reduction with use of estim/ice at end.  Pt progressing towards goals, noting she is able to walk now 8min on treadmill comfortably.    Pt will benefit from skilled therapeutic intervention in order to improve on the following deficits Pain;Decreased range of motion;Decreased strength;Improper body mechanics;Abnormal gait;Decreased endurance;Decreased activity tolerance;Obesity   Rehab Potential Good   PT Frequency 2x / week   PT Duration 6 weeks   PT Treatment/Interventions Patient/family education;ADLs/Self Care Home Management;Manual techniques;Therapeutic exercise;Therapeutic activities;Cryotherapy;Electrical Stimulation;Moist Heat;Ultrasound;Dry needling   PT Next Visit Plan Continue core strengthening/ LE stretching/ extension program.  Trial of treadmill warm up to assess gait; test LE strength.    Consulted and Agree with Plan of Care Patient        Problem List There are no active problems to display for this patient.  Mayer CamelJennifer Carlson-Long, PTA 08/23/2015 9:02 AM  [facility] [address] [place], [place], [address] Phone: [phone]   Fax:  [phone]

## 2015-08-27 ENCOUNTER — Ambulatory Visit (INDEPENDENT_AMBULATORY_CARE_PROVIDER_SITE_OTHER): Payer: BLUE CROSS/BLUE SHIELD | Admitting: Physical Therapy

## 2015-08-27 DIAGNOSIS — R29898 Other symptoms and signs involving the musculoskeletal system: Secondary | ICD-10-CM

## 2015-08-27 DIAGNOSIS — Z7409 Other reduced mobility: Secondary | ICD-10-CM

## 2015-08-27 DIAGNOSIS — M2560 Stiffness of unspecified joint, not elsewhere classified: Secondary | ICD-10-CM

## 2015-08-27 DIAGNOSIS — R531 Weakness: Secondary | ICD-10-CM

## 2015-08-27 DIAGNOSIS — M623 Immobility syndrome (paraplegic): Secondary | ICD-10-CM | POA: Diagnosis not present

## 2015-08-27 DIAGNOSIS — M5441 Lumbago with sciatica, right side: Secondary | ICD-10-CM | POA: Diagnosis not present

## 2015-08-28 NOTE — Therapy (Signed)
[facility] [address] [place], [place], [address] Phone: [phone]   Fax:  [phone]  Physical Therapy Treatment  Patient Details  Name: Kimberly Johnson MRN: [id number] Date of Birth: 1986/04/22 No Data Recorded  Encounter Date: 08/27/2015      PT End of Session - 08/27/15 0816    Visit Number 5   Number of Visits 12   Date for PT Re-Evaluation 09/24/15   PT Start Time 0806   PT Stop Time 0900   PT Time Calculation (min) 54 min      No past medical history on file.  No past surgical history on file.  There were no vitals filed for this visit.  Visit Diagnosis:  Right-sided low back pain with right-sided sciatica  Weakness of right leg  Stiffness due to immobility  Decreased strength, endurance, and mobility      Subjective Assessment - 08/27/15 0816    Subjective "Saturday was great. Very little pain in back. Was able to sit for lunch from 1-5pm without difficulty", but pt reported she had increased pain the next day.   Currently in Pain? Yes   Pain Score 7    Pain Location Back   Pain Orientation Right;Lower   Pain Radiating Towards down post Rt thigh to ankle.    Aggravating Factors  prolonged sitting, standing   Pain Relieving Factors prone press ups.             [facility] PT Assessment - 08/28/15 0001    Assessment   Medical Diagnosis LBP   Onset Date/Surgical Date 06/06/15   Hand Dominance Right   Next MD Visit 11/22/15                     [facility] Adult PT Treatment/Exercise - 08/27/15 0001    Lumbar Exercises: Stretches   Passive Hamstring Stretch 2 reps;30 seconds  supine with strap    Press Ups 5 reps;10 seconds   Quad Stretch 2 reps;30 seconds  (prone with strap), each leg   ITB Stretch 2 reps;1 rep;30 seconds  with strap; no pain   Piriformis Stretch 1 rep;60 seconds  supine    Lumbar Exercises: Aerobic   Stationary Bike NuStep L4: 5 min    Tread Mill 3 min @  1.8 mph.    Lumbar Exercises: Standing   Other Standing Lumbar Exercises HIGH KNEELING:  this position (on 3" foam pad for knee comfort) to single knee with opposite leg flexed/foot on table - 5 reps each side.    Lumbar Exercises: Supine   Bent Knee Raise 10 reps  with ab set   Other Supine Lumbar Exercises hooklying on full foam roll:  side to side roll for paraspinals, marching with core engaged x 15 steps.    Lumbar Exercises: Quadruped   Madcat/Old Horse 5 reps   Modalities   Modalities Cryotherapy;Electrical Stimulation   Cryotherapy   Number Minutes Cryotherapy 15 Minutes   Cryotherapy Location Lumbar Spine   Type of Cryotherapy Ice pack   Electrical Stimulation   Electrical Stimulation Location bilat paraspinals   Electrical Stimulation Action IFC    Electrical Stimulation Parameters to tolerance    Electrical Stimulation Goals Pain                     PT Long Term Goals - 08/23/15 0901    PT LONG TERM GOAL #1   Title Patient I in HEP 09/24/15   Time  6   Period Weeks   Status On-going   PT LONG TERM GOAL #2   Title Patient to tolerate sitting for 30-60 min 09/24/15   Time 6   Period Weeks   Status On-going   PT LONG TERM GOAL #3   Title Rt LE strength 5-/5 to 5/5 throughout 09/24/15   Time 6   Period Weeks   Status On-going   PT LONG TERM GOAL #4   Title Improve walking tolerance to 30-45 min 09/24/15   Time 6   Period Weeks   Status On-going   PT LONG TERM GOAL #5   Title Improve FOTO to </= 48% limitation 09/24/15   Time 6   Period Weeks   Status On-going               Plan - 08/27/15 0847    Clinical Impression Statement Pt reported 2 point decrease with ther ex and further reduction with estim/ice at end of session. Difficulty tolerating foam roll (discomfort at tailbone); all other exercises tolerated without increase in symptoms .  Noted increased Lt lateral trunk flexion with Rt swing through, and increased stance time on LLE.     Pt will benefit from skilled therapeutic intervention in order to improve on the following deficits Pain;Decreased range of motion;Decreased strength;Improper body mechanics;Abnormal gait;Decreased endurance;Decreased activity tolerance;Obesity   Rehab Potential Good   PT Frequency 2x / week   PT Duration 6 weeks   PT Treatment/Interventions Patient/family education;ADLs/Self Care Home Management;Manual techniques;Therapeutic exercise;Therapeutic activities;Cryotherapy;Electrical Stimulation;Moist Heat;Ultrasound;Dry needling   PT Next Visit Plan Continue core strengthening/ LE stretching/ extension program.   test LE strength.    Consulted and Agree with Plan of Care Patient        Problem List There are no active problems to display for this patient.   [name], PTA 08/28/2015 8:50 AM  [facility] [address] [place], [place], [address] Phone: [phone]   Fax:  [phone]  Name: Kimberly Johnson MRN: [id number] Date of Birth: 10-Jan-1986

## 2015-08-30 ENCOUNTER — Ambulatory Visit (INDEPENDENT_AMBULATORY_CARE_PROVIDER_SITE_OTHER): Payer: BLUE CROSS/BLUE SHIELD | Admitting: Physical Therapy

## 2015-08-30 DIAGNOSIS — M623 Immobility syndrome (paraplegic): Secondary | ICD-10-CM

## 2015-08-30 DIAGNOSIS — M2560 Stiffness of unspecified joint, not elsewhere classified: Secondary | ICD-10-CM

## 2015-08-30 DIAGNOSIS — R531 Weakness: Secondary | ICD-10-CM

## 2015-08-30 DIAGNOSIS — Z7409 Other reduced mobility: Secondary | ICD-10-CM

## 2015-08-30 DIAGNOSIS — R29898 Other symptoms and signs involving the musculoskeletal system: Secondary | ICD-10-CM | POA: Diagnosis not present

## 2015-08-30 DIAGNOSIS — M5441 Lumbago with sciatica, right side: Secondary | ICD-10-CM | POA: Diagnosis not present

## 2015-08-30 NOTE — Therapy (Signed)
[facility] [address] [place], [place], [address] Phone: [phone]   Fax:  [phone]  Physical Therapy Treatment  Patient Details  Name: Kimberly Johnson MRN: [id number] Date of Birth: 1986-05-14 No Data Recorded  Encounter Date: 08/30/2015      PT End of Session - 08/30/15 0907    Visit Number 6   Number of Visits 12   Date for PT Re-Evaluation 09/24/15   PT Start Time 0810   PT Stop Time 0859   PT Time Calculation (min) 49 min   Activity Tolerance Patient tolerated treatment well      No past medical history on file.  No past surgical history on file.  There were no vitals filed for this visit.  Visit Diagnosis:  Right-sided low back pain with right-sided sciatica  Weakness of right leg  Stiffness due to immobility  Decreased strength, endurance, and mobility      Subjective Assessment - 08/30/15 0815    Subjective Pt reports back stiffness, leg pressure/pain.  Has been using standing desk at work without issues.  Performed 5 min walking, 5 min stretching x 2 reps at lunch- felt great.  Tuesday night was the first time she didn't need help getting out of car after class (hasn't been able to do this since June)   Currently in Pain? Yes   Pain Score 6    Pain Location Leg   Pain Orientation Right   Pain Descriptors / Indicators Dull;Pressure   Aggravating Factors  prolonged sitting, standing    Pain Relieving Factors prone press up             [facility] PT Assessment - 08/30/15 0001    Assessment   Medical Diagnosis LBP   Onset Date/Surgical Date 06/06/15   Hand Dominance Right   Next MD Visit 11/22/15   Observation/Other Assessments   Focus on Therapeutic Outcomes (FOTO)  48% limited           [name] - 08/30/15 0001    Lumbar Exercises: Stretches   Passive Hamstring Stretch 2 reps;60 seconds   Press Ups 5 reps;10 seconds   Piriformis Stretch 60 seconds;2  reps  supine    Lumbar Exercises: Aerobic   Stationary Bike NuStep L4: 5 min    Lumbar Exercises: Standing   Forward Lunge 10 reps x 2 sets each side, without UE support    Forward Lunge Limitations (with broom in hand to simulate vacuuming)   Side Lunge 20 reps  with broom.    Wall Slides 5 seconds  6 reps, pain in back last rep; stopped   Lumbar Exercises: Seated   Sit to Stand 10 reps  engaging core; to black mat   Modalities   Modalities Cryotherapy;Electrical Stimulation   Cryotherapy   Number Minutes Cryotherapy 15 Minutes   Cryotherapy Location Lumbar Spine   Type of Cryotherapy Ice pack   Electrical Stimulation   Electrical Stimulation Location bilat paraspinals   Electrical Stimulation Action IFC   Electrical Stimulation Parameters to tolerance   Electrical Stimulation Goals Pain                PT Education - 08/30/15 [id number] [id number]    Education provided Yes   Education Details HEP - added side and front lunges   Person(s) Educated Patient   Methods Explanation;Handout;Demonstration   Comprehension Returned demonstration;Verbalized understanding             PT Long Term Goals -  08/23/15 0901    PT LONG TERM GOAL #1   Title Patient I in HEP 09/24/15   Time 6   Period Weeks   Status On-going   PT LONG TERM GOAL #2   Title Patient to tolerate sitting for 30-60 min 09/24/15   Time 6   Period Weeks   Status On-going   PT LONG TERM GOAL #3   Title Rt LE strength 5-/5 to 5/5 throughout 09/24/15   Time 6   Period Weeks   Status On-going   PT LONG TERM GOAL #4   Title Improve walking tolerance to 30-45 min 09/24/15   Time 6   Period Weeks   Status On-going   PT LONG TERM GOAL #5   Title Improve FOTO to </= 48% limitation 09/24/15   Time 6   Period Weeks   Status On-going               Plan - 08/30/15 0908    Clinical Impression Statement Pt was only able to tolerate 6 reps of wall slides before her Rt leg symptoms increased; activity  stopped.  Pt tolerated all other exercises well, without increase in pain. Pt making good gains towards goals with overall decrease in pain and improving sitting/ walking tolerance.  Pt will benefit from continued PT intervention to maximize function and decrease pain.    Pt will benefit from skilled therapeutic intervention in order to improve on the following deficits Pain;Decreased range of motion;Decreased strength;Improper body mechanics;Abnormal gait;Decreased endurance;Decreased activity tolerance;Obesity   Rehab Potential Good   PT Frequency 2x / week   PT Duration 6 weeks   PT Treatment/Interventions Patient/family education;ADLs/Self Care Home Management;Manual techniques;Therapeutic exercise;Therapeutic activities;Cryotherapy;Electrical Stimulation;Moist Heat;Ultrasound;Dry needling   PT Next Visit Plan Continue core strengthening/ LE stretching/ extension program.   test LE strength.    Consulted and Agree with Plan of Care Patient        Problem List There are no active problems to display for this patient.   [name], PTA 08/30/2015 9:36 AM  [facility] [address] [place], [place], [address] Phone: [phone]   Fax:  [phone]  Name: Kimberly Johnson MRN: [id number] Date of Birth: [birth year]

## 2015-08-30 NOTE — Patient Instructions (Signed)
Lunge Side-to-Side    Push right arm to the right and bend right knee. Push left arm to the left and bend left knee. Pull in abdomen. 20 reps each direction.   Body-Weight Forward Lunge: Stable - Stationary (Active)    Tighten abdominals. Stand in wide stride, legs shoulder width apart, head up, back flat. Bend both legs simultaneously until forward thigh is parallel to floor. Complete _1-2__ sets of _10__ repetitions. Perform _1__ sessions per day.   [facility] [address] [place], [place] [address]  [phone]) [phone] (fax)

## 2015-09-03 ENCOUNTER — Ambulatory Visit (INDEPENDENT_AMBULATORY_CARE_PROVIDER_SITE_OTHER): Payer: BLUE CROSS/BLUE SHIELD | Admitting: Physical Therapy

## 2015-09-03 DIAGNOSIS — Z7409 Other reduced mobility: Secondary | ICD-10-CM

## 2015-09-03 DIAGNOSIS — R29898 Other symptoms and signs involving the musculoskeletal system: Secondary | ICD-10-CM

## 2015-09-03 DIAGNOSIS — R6889 Other general symptoms and signs: Secondary | ICD-10-CM

## 2015-09-03 DIAGNOSIS — M623 Immobility syndrome (paraplegic): Secondary | ICD-10-CM | POA: Diagnosis not present

## 2015-09-03 DIAGNOSIS — M5441 Lumbago with sciatica, right side: Secondary | ICD-10-CM

## 2015-09-03 DIAGNOSIS — M2560 Stiffness of unspecified joint, not elsewhere classified: Secondary | ICD-10-CM

## 2015-09-03 DIAGNOSIS — R531 Weakness: Secondary | ICD-10-CM

## 2015-09-03 NOTE — Therapy (Signed)
[facility] [place] [address] [place], [place], [address] Phone: [phone]   Fax:  [phone]  Physical Therapy Treatment  Patient Details  Name: Kimberly Johnson MRN: [id number] Date of Birth: [birth year] No Data Recorded  Encounter Date: 09/03/2015      PT End of Session - 09/03/15 0806    Visit Number 7   Number of Visits 12   Date for PT Re-Evaluation 09/24/15   PT Start Time 0805   PT Stop Time 0904   PT Time Calculation (min) 59 min      No past medical history on file.  No past surgical history on file.  There were no vitals filed for this visit.  Visit Diagnosis:  Right-sided low back pain with right-sided sciatica  Weakness of right leg  Stiffness due to immobility  Decreased strength, endurance, and mobility      Subjective Assessment - 09/03/15 0813    Subjective Pt noticing decreased overall pain in back.  Pressure in Rt Leg persists.  Now stands for first 1-2 hrs, then alternates between sitting and standing remainder of day.    Currently in Pain? Yes   Pain Score 5    Pain Location Back   Pain Orientation Right   Pain Descriptors / Indicators Dull   Aggravating Factors  prolong sitting    Pain Relieving Factors prone press up.             [facility] PT Assessment - 09/03/15 0001    Assessment   Medical Diagnosis LBP   Onset Date/Surgical Date 06/06/15   Hand Dominance Right   Next MD Visit 11/22/15   Strength   Right Hip Extension --  5-/5   Flexibility   Quadriceps Lt 4" Rt 4" heel to buttocks             [name] - 09/03/15 0001    Lumbar Exercises: Stretches   Passive Hamstring Stretch 3 reps;30 seconds  supine with strap   Press Ups 5 reps;10 seconds   Quad Stretch 30 seconds;3 reps  (prone with strap), each leg   ITB Stretch 2 reps;1 rep;30 seconds  with strap; no pain   Piriformis Stretch 60 seconds;2 reps  supine    Lumbar Exercises:  Aerobic   Tread Mill 5 min @ 2.1 mph  Noted RLE circumducts.    Lumbar Exercises: Standing   Side Lunge 10 reps   Lumbar Exercises: Prone   Other Prone Lumbar Exercises Pt taught self MFR to ant hip; returned demo and held each tender spot on Rt hip ~1 min    Lumbar Exercises: Quadruped   Madcat/Old Horse 5 reps   Modalities   Modalities Cryotherapy   Cryotherapy   Number Minutes Cryotherapy 15 Minutes   Cryotherapy Location Lumbar Spine   Type of Cryotherapy Ice pack   Electrical Stimulation   Electrical Stimulation Location bilat paraspinals   Electrical Stimulation Action IFC   Electrical Stimulation Parameters to tolerance    Electrical Stimulation Goals Pain   Manual Therapy   Manual Therapy Myofascial release   Myofascial Release MFR to glute/ piriformis/ psoas, QL Rt .                      PT Long Term Goals - 08/23/15 0901    PT LONG TERM GOAL #1   Title Patient I in HEP 09/24/15   Time 6   Period Weeks   Status On-going  PT LONG TERM GOAL #2   Title Patient to tolerate sitting for 30-60 min 09/24/15   Time 6   Period Weeks   Status On-going   PT LONG TERM GOAL #3   Title Rt LE strength 5-/5 to 5/5 throughout 09/24/15   Time 6   Period Weeks   Status On-going   PT LONG TERM GOAL #4   Title Improve walking tolerance to 30-45 min 09/24/15   Time 6   Period Weeks   Status On-going   PT LONG TERM GOAL #5   Title Improve FOTO to </= 48% limitation 09/24/15   Time 6   Period Weeks   Status On-going               Plan - 09/03/15 1409    Clinical Impression Statement Noted pt circumducts RLE with decreased hip flexion with gait.  Pt was very point tender in Rt hip flexor/ iliacus, Rt piriformis with manual therapy.  Pt reported reduction of symptoms after treatment.  Pt continues to make gains with each visit; will benefit from continued PT intervention to maximize function and decrease pain.    Pt will benefit from skilled therapeutic  intervention in order to improve on the following deficits Pain;Decreased range of motion;Decreased strength;Improper body mechanics;Abnormal gait;Decreased endurance;Decreased activity tolerance;Obesity   Rehab Potential Good   PT Frequency 2x / week   PT Duration 6 weeks   PT Treatment/Interventions Patient/family education;ADLs/Self Care Home Management;Manual techniques;Therapeutic exercise;Therapeutic activities;Cryotherapy;Electrical Stimulation;Moist Heat;Ultrasound;Dry needling   PT Next Visit Plan Continue core strengthening/ LE stretching/ extension program.     Consulted and Agree with Plan of Care Patient        Problem List There are no active problems to display for this patient.   Mayer CamelJennifer Carlson-Long, PTA 09/03/2015 2:45 PM  [facility] [address] [place], [place], [address] Phone: [phone]   Fax:  [phone]  Name: Kimberly Johnson MRN: [id number] Date of Birth: [birth year]

## 2015-09-06 ENCOUNTER — Ambulatory Visit (INDEPENDENT_AMBULATORY_CARE_PROVIDER_SITE_OTHER): Payer: BLUE CROSS/BLUE SHIELD | Admitting: Physical Therapy

## 2015-09-06 DIAGNOSIS — M623 Immobility syndrome (paraplegic): Secondary | ICD-10-CM

## 2015-09-06 DIAGNOSIS — Z7409 Other reduced mobility: Secondary | ICD-10-CM

## 2015-09-06 DIAGNOSIS — M5441 Lumbago with sciatica, right side: Secondary | ICD-10-CM

## 2015-09-06 DIAGNOSIS — R29898 Other symptoms and signs involving the musculoskeletal system: Secondary | ICD-10-CM

## 2015-09-06 DIAGNOSIS — R531 Weakness: Secondary | ICD-10-CM

## 2015-09-06 DIAGNOSIS — M2560 Stiffness of unspecified joint, not elsewhere classified: Secondary | ICD-10-CM

## 2015-09-06 NOTE — Therapy (Signed)
[facility] [place], [place], [address] Phone: [phone]   Fax:  [phone]  Physical Therapy Treatment  Patient Details  Name: Kimberly Johnson MRN: [id number] Date of Birth: 10/20/1986 No Data Recorded  Encounter Date: 09/06/2015      PT End of Session - 09/06/15 1419    Visit Number 8   Number of Visits 12   Date for PT Re-Evaluation 09/24/15   PT Start Time 0805   PT Stop Time 0900   PT Time Calculation (min) 55 min   Activity Tolerance Patient tolerated treatment well      No past medical history on file.  No past surgical history on file.  There were no vitals filed for this visit.  Visit Diagnosis:  Right-sided low back pain with right-sided sciatica  Weakness of right leg  Stiffness due to immobility  Decreased strength, endurance, and mobility      Subjective Assessment - 09/06/15 0814    Subjective Pt reports she did ok after last session. Notices how she swings her Rt leg during gait.  "Last wk was awesome, this week has been a little less great"  Pt reported she is up to walking 10 min on treadmill.    Currently in Pain? Yes   Pain Score 4    Pain Location Back   Pain Orientation Right   Pain Descriptors / Indicators Dull;Sharp  (depends on activity)   Aggravating Factors  prolonged sitting   Pain Relieving Factors prone press up, ice, estim            [facility] PT Assessment - 09/06/15 0001    Assessment   Medical Diagnosis LBP   Onset Date/Surgical Date 06/06/15   Hand Dominance Right   Next MD Visit 11/22/15   Strength   Right Hip Flexion --  5-/5, Pain in SI joint           [name] - 09/06/15 0001    Exercises   Exercises Knee/Hip;Lumbar   Lumbar Exercises: Stretches   Passive Hamstring Stretch 3 reps;30 seconds  supine with strap   Press Ups 5 reps;10 seconds   ITB Stretch 2 reps;1 rep;30 seconds  with strap; no pain   ITB  Stretch Limitations (also adductor stretch with strap x 30 sec x 2 each leg)   Lumbar Exercises: Aerobic   Elliptical L1: 1.5 min, stopped   Tread Mill 4 min @ 1.9 mph   Lumbar Exercises: Supine   Heel Slides 10 reps  with ab set   Heel Slides Limitations VC for technique   Bent Knee Raise 10 reps  with ab set   Bent Knee Raise Limitations VC for form   Knee/Hip Exercises: Stretches   Gastroc Stretch Right;Left;2 reps;20 seconds   Soleus Stretch Right;Left;2 reps;20 seconds   Modalities   Modalities Cryotherapy   Cryotherapy   Number Minutes Cryotherapy 15 Minutes   Cryotherapy Location Lumbar Spine   Type of Cryotherapy Ice pack   Electrical Stimulation   Electrical Stimulation Location bilat Lower lumbar paraspinals   Electrical Stimulation Action IFC   Electrical Stimulation Parameters to tolerance    Electrical Stimulation Goals Pain   Manual Therapy   Manual Therapy Myofascial release   Myofascial Release MFR to bilat QL and Rt psoas           PT Long Term Goals - 09/06/15 1636    PT LONG TERM GOAL #1   Title Patient I  in HEP 09/24/15   Time 6   Period Weeks   Status On-going   PT LONG TERM GOAL #2   Title Patient to tolerate sitting for 30-60 min 09/24/15   Time 6   Period Weeks   Status On-going   PT LONG TERM GOAL #3   Title Rt LE strength 5-/5 to 5/5 throughout 09/24/15   Time 6   Period Weeks   Status Achieved   PT LONG TERM GOAL #4   Title Improve walking tolerance to 30-45 min 09/24/15   Time 6   Period Weeks   Status On-going   PT LONG TERM GOAL #5   Title Improve FOTO to </= 48% limitation 09/24/15   Time 6   Period Weeks   Status On-going               Plan - 09/06/15 0919    Clinical Impression Statement Pt was point tender in Rt anterior hip and QL with manual therapy, decreased pain with estim/ ice pack to area at end of session.  Pt required some cues for proper form of exercises.   Has met LTG#3 with improved Rt hip strength.     Pt will benefit from skilled therapeutic intervention in order to improve on the following deficits Pain;Decreased range of motion;Decreased strength;Improper body mechanics;Abnormal gait;Decreased endurance;Decreased activity tolerance;Obesity   Rehab Potential Good   PT Frequency 2x / week   PT Duration 6 weeks   PT Treatment/Interventions Patient/family education;ADLs/Self Care Home Management;Manual techniques;Therapeutic exercise;Therapeutic activities;Cryotherapy;Electrical Stimulation;Moist Heat;Ultrasound;Dry needling   PT Next Visit Plan Continue core strengthening/ LE stretching/ extension program.     Consulted and Agree with Plan of Care Patient        Problem List There are no active problems to display for this patient.  Kerin Perna, PTA 09/06/2015 4:37 PM  [place], [place], [address] Phone: [phone]   Fax:  [phone]  Name: Kimberly Johnson MRN: [id number] Date of Birth: 1986-06-22

## 2015-09-10 ENCOUNTER — Ambulatory Visit (INDEPENDENT_AMBULATORY_CARE_PROVIDER_SITE_OTHER): Payer: BLUE CROSS/BLUE SHIELD | Admitting: Rehabilitative and Restorative Service Providers"

## 2015-09-10 ENCOUNTER — Encounter: Payer: Self-pay | Admitting: Rehabilitative and Restorative Service Providers"

## 2015-09-10 ENCOUNTER — Encounter (INDEPENDENT_AMBULATORY_CARE_PROVIDER_SITE_OTHER): Payer: Self-pay

## 2015-09-10 DIAGNOSIS — Z7409 Other reduced mobility: Secondary | ICD-10-CM

## 2015-09-10 DIAGNOSIS — M5441 Lumbago with sciatica, right side: Secondary | ICD-10-CM

## 2015-09-10 DIAGNOSIS — M2560 Stiffness of unspecified joint, not elsewhere classified: Secondary | ICD-10-CM

## 2015-09-10 DIAGNOSIS — R29898 Other symptoms and signs involving the musculoskeletal system: Secondary | ICD-10-CM

## 2015-09-10 DIAGNOSIS — R6889 Other general symptoms and signs: Secondary | ICD-10-CM

## 2015-09-10 DIAGNOSIS — M623 Immobility syndrome (paraplegic): Secondary | ICD-10-CM

## 2015-09-10 DIAGNOSIS — R531 Weakness: Secondary | ICD-10-CM

## 2015-09-10 DIAGNOSIS — M25512 Pain in left shoulder: Secondary | ICD-10-CM

## 2015-09-10 NOTE — Patient Instructions (Signed)
Can use ball along the front, back and side of the shoulder to release the tightness.  Work on posture and alignment throughout the day - pulling shoulder blades down and back   Shoulder Blade Squeeze    Rotate shoulders back, then squeeze shoulder blades down and back. Hold 10 sec. Repeat __10__ times. Do __several __ sessions per day. Can use swim noodle or door frame along spine to help remind you of posture and alignment

## 2015-09-10 NOTE — Therapy (Signed)
[facility] [address] [place], [place], [address] Phone: [phone]   Fax:  [phone]  Physical Therapy Treatment  Patient Details  Name: Kimberly Johnson MRN: [id number] Date of Birth: 1986-08-27 No Data Recorded  Encounter Date: 09/10/2015      PT End of Session - 09/10/15 0934    Visit Number 9   Number of Visits 18  added 6 visits 09/10/15   Date for PT Re-Evaluation 10/15/15   PT Start Time 0849   PT Stop Time 0941   PT Time Calculation (min) 52 min   Activity Tolerance Patient limited by pain  pt nauseated unable to finish treatment      History reviewed. No pertinent past medical history.  History reviewed. No pertinent past surgical history.  There were no vitals filed for this visit.  Visit Diagnosis:  Right-sided low back pain with right-sided sciatica - Plan: PT plan of care cert/re-cert  Weakness of right leg - Plan: PT plan of care cert/re-cert  Stiffness due to immobility - Plan: PT plan of care cert/re-cert  Decreased strength, endurance, and mobility - Plan: PT plan of care cert/re-cert  Pain in shoulder region, left - Plan: PT plan of care cert/re-cert      Subjective Assessment - 09/10/15 0850    Subjective Pt reports that she has had Lt shoulder pain for the past 3-4 weeks. She has pain reaching up and when she awakens in the am.    Pertinent History LBP 2004/2005 - disc surgery L4/5 05/2004 - cervical disc surgery with fusiioin C2/3 10/15 - gall baldder 01/07/13; breast reduction 10/31/07; migraines   How long can you sit comfortably? 30 min    How long can you stand comfortably? 1-2 hours   How long can you walk comfortably? 20 min   Diagnostic tests MRI - DDD/spinal stenosis lumbar spine   Patient Stated Goals to be able to walk again daily; stand and sit comfortably    Currently in Pain? Yes   Pain Score 6    Pain Location Back   Pain Orientation Right   Pain Descriptors /  Indicators Dull;Sharp   Multiple Pain Sites Yes   Pain Score 7   Pain Location Shoulder   Pain Orientation Left   Pain Descriptors / Indicators Sharp;Stabbing   Pain Type Acute pain   Pain Radiating Towards 3-4/10 at rest - 7/10 with movement   Pain Onset 1 to 4 weeks ago   Pain Frequency Intermittent   Aggravating Factors  reaching up; movement; reaching up and out; lifting; closing the shower curtain; lifting up her standing desk   Pain Relieving Factors holding arm still; ice; supporting arm even with body            [facility] PT Assessment - 09/10/15 0001    Assessment   Medical Diagnosis LPB/ Lt shoulder pain   Onset Date/Surgical Date 06/06/15  shoulder 09/16   Next MD Visit as needed   Sensation   Additional Comments WFL's has had some shooting pain into the Lt hand 3-4 times - does have some "left over" nerve pain from neck surgery. 08/16/14   Posture/Postural Control   Posture Comments head forward; increased thoracic kyphosis;Lt knee hyperextended; arms in IR at sides   AROM   Right Shoulder Extension 73 Degrees   Right Shoulder Flexion 177 Degrees   Right Shoulder ABduction 168 Degrees   Right Shoulder Internal Rotation 43 Degrees   Right Shoulder External Rotation 94  Degrees   Left Shoulder Extension 70 Degrees  mild pain   Left Shoulder Flexion 167 Degrees  pain   Left Shoulder ABduction 146 Degrees  Lt shoul Rt LBP   Left Shoulder Internal Rotation 17 Degrees  pain   Left Shoulder External Rotation 52 Degrees  worst pain in the shd of all motions tested   Cervical Flexion 42   Cervical Extension 43   Cervical - Right Side Bend 29  mild pain   Cervical - Left Side Bend 30   Cervical - Right Rotation 59   Cervical - Left Rotation 53  "tender" into the Lt shoulder    Strength   Right/Left Shoulder --  5/5 Rt /Lt shd except Lt IR psin with testing Lt    Left Shoulder Internal Rotation 4+/5  painful    Palpation   Palpation comment tenderness and  tightness through Lt upper quadrant - pecs/trap/leveator/periscapular musculatre                      [name] - 09/10/15 0001    Therapeutic Activites    Therapeutic Activities --  myofacial ball release for shoulder girdle area   Neuro Re-ed    Neuro Re-ed Details  encouraged work on posture and alignment engaging posterior shoulder girdle    Exercises   Exercises Shoulder;Lumbar;Knee/Hip   Shoulder Exercises: Standing   Retraction --  scap squeeze with noodle 10 sec hold 10 reps                 PT Education - 09/10/15 0929    Education provided Yes   Education Details Importance of posture and alignment; postural correctioin; HEP   Person(s) Educated Patient   Methods Explanation;Demonstration;Tactile cues;Verbal cues;Handout   Comprehension Verbalized understanding;Returned demonstration;Verbal cues required;Tactile cues required             PT Long Term Goals - 09/10/15 1312    PT LONG TERM GOAL #1   Title Patient I in HEP 10/22/15   Time 12  added 6 weeks to POC 09/10/15   Period Weeks   Status New   PT LONG TERM GOAL #2   Title Patient to tolerate sitting for 30-60 min 09/24/15   Time 6   Period Weeks   Status On-going   PT LONG TERM GOAL #3   Title Full pain free AROM Lt shoulder 10/22/15   Time 6   Period Weeks   Status New   PT LONG TERM GOAL #4   Title Improve walking tolerance to 30-45 min 10/22/15   Baseline Now walking 20 min    Time 6   Period Weeks   Status On-going   PT LONG TERM GOAL #5   Title Improve FOTO to </= 48% limitation 10/22/15   Time 6   Period Weeks   Status On-going   Additional Long Term Goals   Additional Long Term Goals Yes   PT LONG TERM GOAL #6   Title Pt able to reach up to close/open shower curtain with minimal Lt shoulder pain 10/22/15   Time 6   Period Weeks   Status New               Plan - 09/10/15 1610    Clinical Impression Statement Evaluation Lt  shoulder pain reveals limited ROM; weakness IR likely related to pain; tenderness and tightness to palpation Lt upper quadrant including pecs/upper trap/leveator/periscapular mm. Patient reports limited functional activity level  and pain with functional reaching/lifting/etc. She will benefit from PT to address shoulder limitatiions as well as continue to work on back rehab.    Pt will benefit from skilled therapeutic intervention in order to improve on the following deficits Pain;Decreased range of motion;Decreased strength;Improper body mechanics;Abnormal gait;Decreased endurance;Decreased activity tolerance;Obesity   Rehab Potential Good   PT Frequency 2x / week   PT Duration 12 weeks   PT Treatment/Interventions Patient/family education;ADLs/Self Care Home Management;Manual techniques;Therapeutic exercise;Therapeutic activities;Cryotherapy;Electrical Stimulation;Moist Heat;Ultrasound;Dry needling   PT Next Visit Plan Continue core strengthening/ LE stretching/ extension program; add shoulder rehab - working on posture and alignment/position of scapulae along thoracic wall; stretching for anterior shoulder girdle; strengthening posterior shoulder girdle; ROM Lt shoulder    PT Home Exercise Plan HEP; myofacial ball release work; discussed TENS unit and use of ice for home pain management; scap squeeze with noodle    Consulted and Agree with Plan of Care Patient        Problem List There are no active problems to display for this patient.   Chantz Montefusco Rober MinionP Mavrik Bynum PT, MPH 09/10/2015, 1:19 PM  [facility] [address] [place] [place] [address] [place], [place], [address] Phone: [phone]   Fax:  [phone]  Name: Marlise EvesOlivia Brunker MRN: [id number] Date of Birth: [birth year]

## 2015-09-13 ENCOUNTER — Encounter (INDEPENDENT_AMBULATORY_CARE_PROVIDER_SITE_OTHER): Payer: Self-pay

## 2015-09-13 ENCOUNTER — Ambulatory Visit (INDEPENDENT_AMBULATORY_CARE_PROVIDER_SITE_OTHER): Payer: BLUE CROSS/BLUE SHIELD | Admitting: Physical Therapy

## 2015-09-13 DIAGNOSIS — M5441 Lumbago with sciatica, right side: Secondary | ICD-10-CM | POA: Diagnosis not present

## 2015-09-13 DIAGNOSIS — M25512 Pain in left shoulder: Secondary | ICD-10-CM

## 2015-09-13 DIAGNOSIS — Z7409 Other reduced mobility: Secondary | ICD-10-CM

## 2015-09-13 DIAGNOSIS — M2560 Stiffness of unspecified joint, not elsewhere classified: Secondary | ICD-10-CM

## 2015-09-13 DIAGNOSIS — R29898 Other symptoms and signs involving the musculoskeletal system: Secondary | ICD-10-CM | POA: Diagnosis not present

## 2015-09-13 DIAGNOSIS — M623 Immobility syndrome (paraplegic): Secondary | ICD-10-CM

## 2015-09-13 DIAGNOSIS — R531 Weakness: Secondary | ICD-10-CM

## 2015-09-13 NOTE — Patient Instructions (Signed)
External Rotation (Passive)    With elbow bent and forearm on table, palm down, bend forward at waist until a stretch is felt. Hold __15_ seconds. Repeat __5__ times. Do _1___ sessions per day.  Flexion (Passive)    Sitting upright, slide forearm forward along table, bending from the waist until a stretch is felt. Hold __15__ seconds. Repeat __5__ times. Do __1__ sessions per day.    [facility] [address] [place], [place] [address]  [phone] (office) [phone] (fax)

## 2015-09-13 NOTE — Therapy (Signed)
[facility] [address] [place], [place], [address] Phone: [phone]   Fax:  [phone]  Physical Therapy Treatment  Patient Details  Name: Kimberly EvesOlivia Johnson MRN: [id number] Date of Birth: 1986/05/13 No Data Recorded  Encounter Date: 09/13/2015      PT End of Session - 09/13/15 0902    Visit Number 10   Number of Visits 18   Date for PT Re-Evaluation 10/15/15   PT Start Time 0852   PT Stop Time 0946   PT Time Calculation (min) 54 min      No past medical history on file.  No past surgical history on file.  There were no vitals filed for this visit.  Visit Diagnosis:  Right-sided low back pain with right-sided sciatica  Weakness of right leg  Stiffness due to immobility  Decreased strength, endurance, and mobility  Pain in shoulder region, left      Subjective Assessment - 09/13/15 0904    Subjective Pt reports that she wasn't able to work out yesterday, feels it made back tighter and more painful.  Continues with HEP.     Currently in Pain? Yes   Pain Score 6    Pain Location Back   Pain Orientation Right;Lower   Pain Descriptors / Indicators Dull   Pain Radiating Towards down post Rt thigh into foot.    Aggravating Factors  prolonged sitting   Pain Relieving Factors prone press ups, ice, estim   Pain Score 0  0 at rest, 7/10 with certain movements.    Pain Location Shoulder   Pain Orientation Left   Pain Descriptors / Indicators Clance BollSharp            [facility] PT Assessment - 09/13/15 0001    Assessment   Medical Diagnosis LPB/ Lt shoulder pain   Onset Date/Surgical Date 06/06/15  shoulder 09/16   Next MD Visit as needed   Observation/Other Assessments   Focus on Therapeutic Outcomes (FOTO)  67% limited.  (intake was 69% limited, last assed on 08/30/15 - 48% limited)   Strength   Right/Left Shoulder Left   Left Shoulder Internal Rotation 4+/5  painful    Right Hip Flexion --  5-/5, Pain in  SI joint   Right Hip Extension --  5-/5, with pain   Palpation   Palpation comment tenderness and tightness through Lt upper quadrant - pecs/trap/leveator/periscapular musculatre           [name] - 09/13/15 0001    Lumbar Exercises: Stretches   Passive Hamstring Stretch 2 reps;30 seconds   Passive Hamstring Stretch Limitations seated with straight back   Press Ups 5 reps;10 seconds   Piriformis Stretch 2 reps;30 seconds  eac leg, seated   Lumbar Exercises: Aerobic   Tread Mill 3 min @ 2.740mph, 2 min @ 2.2 mph   Lumbar Exercises: Prone   Straight Leg Raise 10 reps   Lumbar Exercises: Quadruped   Madcat/Old Horse 5 reps   Knee/Hip Exercises: Standing   Functional Squat 5 reps;2 sets  (to black mat table, with staggered stance)   Shoulder Exercises: Pulleys   Flexion 1 minute   Flexion Limitations difficulty relaxing to allow AAROM   Shoulder Exercises: Stretch   Table Stretch - Flexion 5 reps;20 seconds   Table Stretch - External Rotation Limitations 5 reps, 20 sec   Modalities   Modalities Electrical Stimulation;Cryotherapy   Cryotherapy   Number Minutes Cryotherapy 15 Minutes   Cryotherapy Location Lumbar  Spine   Type of Cryotherapy Ice pack   Electrical Stimulation   Electrical Stimulation Location bilat Lower lumbar paraspinals   Electrical Stimulation Action IFC   Electrical Stimulation Parameters to tolerance   Electrical Stimulation Goals Pain          PT Education - 09/13/15 0914    Education provided Yes   Education Details HEP - passive shoulder stretches    Person(s) Educated Patient   Methods Explanation;Handout;Demonstration   Comprehension Returned demonstration;Verbalized understanding             PT Long Term Goals - 09/10/15 1312    PT LONG TERM GOAL #1   Title Patient I in HEP 10/22/15   Time 12  added 6 weeks to POC 09/10/15   Period Weeks   Status New   PT LONG TERM GOAL #2   Title Patient to tolerate  sitting for 30-60 min 09/24/15   Time 6   Period Weeks   Status On-going   PT LONG TERM GOAL #3   Title Full pain free AROM Lt shoulder 10/22/15   Time 6   Period Weeks   Status New   PT LONG TERM GOAL #4   Title Improve walking tolerance to 30-45 min 10/22/15   Baseline Now walking 20 min    Time 6   Period Weeks   Status On-going   PT LONG TERM GOAL #5   Title Improve FOTO to </= 48% limitation 10/22/15   Time 6   Period Weeks   Status On-going   Additional Long Term Goals   Additional Long Term Goals Yes   PT LONG TERM GOAL #6   Title Pt able to reach up to close/open shower curtain with minimal Lt shoulder pain 10/22/15   Time 6   Period Weeks   Status New               Plan - 09/13/15 1246    Clinical Impression Statement Pt has been treated for 10 visits for LBP with sciatica and is now also being treated for Lt shoulder pain.  Pt tolerated new exercises without increase in pain in either treated area.  Pt making progress towards established goals, reporting increased tolerance for sitting and walking. Pt will benefit from additional physical therapy visits to address shoulder limitations, pain in shoulder and Low back, functional limitations with both areas.     Pt will benefit from skilled therapeutic intervention in order to improve on the following deficits Pain;Decreased range of motion;Decreased strength;Improper body mechanics;Abnormal gait;Decreased endurance;Decreased activity tolerance;Obesity   Rehab Potential Good   PT Frequency 2x / week   PT Duration 12 weeks   PT Treatment/Interventions Patient/family education;ADLs/Self Care Home Management;Manual techniques;Therapeutic exercise;Therapeutic activities;Cryotherapy;Electrical Stimulation;Moist Heat;Ultrasound;Dry needling   PT Next Visit Plan Continue core strengthening/ LE stretching/ extension program;  posture and alignment/position of scapulae along thoracic wall; stretching for anterior shoulder  girdle; strengthening posterior shoulder girdle; ROM Lt shoulder    Consulted and Agree with Plan of Care Patient        Problem List There are no active problems to display for this patient.  [name], PTA 09/13/2015 1:04 PM  [facility] [place] [address] [place] [place], [place], [address] Phone: [phone]   Fax:  [phone]  Name: Kimberly Johnson MRN: [id number] Date of Birth: Jan 25, 1986

## 2015-09-17 ENCOUNTER — Encounter: Payer: BLUE CROSS/BLUE SHIELD | Admitting: Physical Therapy

## 2015-09-20 ENCOUNTER — Encounter: Payer: BLUE CROSS/BLUE SHIELD | Admitting: Physical Therapy

## 2015-09-24 ENCOUNTER — Ambulatory Visit (INDEPENDENT_AMBULATORY_CARE_PROVIDER_SITE_OTHER): Payer: BLUE CROSS/BLUE SHIELD | Admitting: Physical Therapy

## 2015-09-24 ENCOUNTER — Encounter (INDEPENDENT_AMBULATORY_CARE_PROVIDER_SITE_OTHER): Payer: Self-pay

## 2015-09-24 DIAGNOSIS — R29898 Other symptoms and signs involving the musculoskeletal system: Secondary | ICD-10-CM

## 2015-09-24 DIAGNOSIS — M25512 Pain in left shoulder: Secondary | ICD-10-CM

## 2015-09-24 DIAGNOSIS — M5441 Lumbago with sciatica, right side: Secondary | ICD-10-CM

## 2015-09-24 DIAGNOSIS — Z7409 Other reduced mobility: Secondary | ICD-10-CM | POA: Diagnosis not present

## 2015-09-24 DIAGNOSIS — M623 Immobility syndrome (paraplegic): Secondary | ICD-10-CM | POA: Diagnosis not present

## 2015-09-24 DIAGNOSIS — R531 Weakness: Secondary | ICD-10-CM

## 2015-09-24 DIAGNOSIS — M2560 Stiffness of unspecified joint, not elsewhere classified: Secondary | ICD-10-CM

## 2015-09-24 NOTE — Therapy (Signed)
[facility] [address] [place], [place], [address] Phone: [phone]   Fax:  [phone]  Physical Therapy Treatment  Patient Details  Name: Kimberly EvesOlivia Johnson MRN: [id number] Date of Birth: 01-Apr-1986 No Data Recorded  Encounter Date: 09/24/2015      PT End of Session - 09/24/15 [id number]    Visit Number 11   Number of Visits 18   Date for PT Re-Evaluation 10/15/15   PT Start Time 0805   PT Stop Time 0901   PT Time Calculation (min) 56 min      No past medical history on file.  No past surgical history on file.  There were no vitals filed for this visit.  Visit Diagnosis:  Right-sided low back pain with right-sided sciatica  Weakness of right leg  Stiffness due to immobility  Decreased strength, endurance, and mobility  Pain in shoulder region, left      Subjective Assessment - 09/24/15 0812    Subjective Pt reports that this weekend (2 days ago) she had hardly any LBP.  But then yesterday and this morning, LB and RLE has felt tight and painful.  Pt reports she walked 25 straight min on Wednesday, had some tightness that was resolved by stretches.    Patient Stated Goals to be able to walk again daily; stand and sit comfortably    Currently in Pain? Yes   Pain Score 7    Pain Location Back   Pain Orientation Right;Lower   Pain Descriptors / Indicators Sharp   Pain Radiating Towards down posterior Rt thigh to foot    Aggravating Factors  prolonged sitting   Pain Relieving Factors prone press ups, ice    Multiple Pain Sites Yes   Pain Score 4   Pain Location Shoulder            [facility] PT Assessment - 09/24/15 0001    Assessment   Medical Diagnosis LPB/ Lt shoulder pain   Onset Date/Surgical Date 06/06/15  shoulder 09/16   Next MD Visit as needed           [facility] - 09/24/15 0001    Lumbar Exercises: Stretches   Passive Hamstring Stretch 3 reps;30 seconds  supine  with strap   Press Ups 5 reps;10 seconds   ITB Stretch 2 reps;30 seconds   ITB Stretch Limitations (also adductor stretch with strap x 30 sec x 2 each leg)   Piriformis Stretch 2 reps;30 seconds  eac leg, supine   Lumbar Exercises: Aerobic   Stationary Bike NuStep L4: 2.5 min    Tread Mill 3 min @ 1.9 - RLE began to tingle - stopped    Lumbar Exercises: Prone   Opposite Arm/Leg Raise Right arm/Left leg;Left arm/Right leg;10 reps   Lumbar Exercises: Quadruped   Madcat/Old Horse 5 reps   Shoulder Exercises: Standing   External Rotation Strengthening;Left;10 reps;Theraband   Theraband Level (Shoulder External Rotation) Level 2 (Red)   Other Standing Exercises wall wash x 10 reps with LUE.  Repeated in ABD x 5 reps    Modalities   Modalities Electrical Stimulation;Cryotherapy   Cryotherapy   Number Minutes Cryotherapy 15 Minutes   Cryotherapy Location Lumbar Spine   Type of Cryotherapy Ice pack   Electrical Stimulation   Electrical Stimulation Location bilat Lower lumbar paraspinals/ Rt glute   Electrical Stimulation Action IFC   Electrical Stimulation Parameters to tolerance    Electrical Stimulation Goals Pain   Manual Therapy  Manual Therapy Soft tissue mobilization;Passive ROM   Soft tissue mobilization to Rt /Lt glute and hamstrings with passive ER/IR to LE.    Passive ROM to Lt shoulder in all directions while pt in supine, stretching at end range.              PT Long Term Goals - 09/10/15 1312    PT LONG TERM GOAL #1   Title Patient I in HEP 10/22/15   Time 12  added 6 weeks to POC 09/10/15   Period Weeks   Status New   PT LONG TERM GOAL #2   Title Patient to tolerate sitting for 30-60 min 09/24/15   Time 6   Period Weeks   Status On-going   PT LONG TERM GOAL #3   Title Full pain free AROM Lt shoulder 10/22/15   Time 6   Period Weeks   Status New   PT LONG TERM GOAL #4   Title Improve walking tolerance to 30-45 min 10/22/15   Baseline Now walking 20 min     Time 6   Period Weeks   Status On-going   PT LONG TERM GOAL #5   Title Improve FOTO to </= 48% limitation 10/22/15   Time 6   Period Weeks   Status On-going   Additional Long Term Goals   Additional Long Term Goals Yes   PT LONG TERM GOAL #6   Title Pt able to reach up to close/open shower curtain with minimal Lt shoulder pain 10/22/15   Time 6   Period Weeks   Status New            Plan - 09/24/15 1049    Clinical Impression Statement Pt demonstrated improved Lt shoulder ROM with PROM in supine.  Pt tolerated new shoulder and LB exercises without increase in pain.  Pt reported decrease in LBP after manual therapy/ stretches and further reduction with use of estim and ice.  Pt making good progress towards existing goals, noting improved walking tolerance to 20-25 minutes.    Pt will benefit from skilled therapeutic intervention in order to improve on the following deficits Pain;Decreased range of motion;Decreased strength;Improper body mechanics;Abnormal gait;Decreased endurance;Decreased activity tolerance;Obesity   Rehab Potential Good   PT Frequency 1x / week  spoke to supervising PT, Celyn Holt, regarding reduction to 1x due to insurance coverage.    PT Duration 12 weeks   PT Treatment/Interventions Patient/family education;ADLs/Self Care Home Management;Manual techniques;Therapeutic exercise;Therapeutic activities;Cryotherapy;Electrical Stimulation;Moist Heat;Ultrasound;Dry needling   PT Next Visit Plan Continue core strengthening/ LE stretching/ extension program;  posture and alignment/position of scapulae along thoracic wall; stretching for anterior shoulder girdle; strengthening posterior shoulder girdle; ROM Lt shoulder    Consulted and Agree with Plan of Care Patient        Problem List There are no active problems to display for this patient.   [name], PTA 09/24/2015 10:56 AM   [facility] [address] [place], [place], [address] Phone: [phone]   Fax:  [phone]  Name: Kimberly Johnson MRN: [id number] Date of Birth: 1986-03-12

## 2015-09-27 ENCOUNTER — Encounter: Payer: BLUE CROSS/BLUE SHIELD | Admitting: Physical Therapy

## 2015-10-01 ENCOUNTER — Ambulatory Visit (INDEPENDENT_AMBULATORY_CARE_PROVIDER_SITE_OTHER): Payer: BLUE CROSS/BLUE SHIELD | Admitting: Physical Therapy

## 2015-10-01 DIAGNOSIS — M623 Immobility syndrome (paraplegic): Secondary | ICD-10-CM

## 2015-10-01 DIAGNOSIS — R29898 Other symptoms and signs involving the musculoskeletal system: Secondary | ICD-10-CM

## 2015-10-01 DIAGNOSIS — M2560 Stiffness of unspecified joint, not elsewhere classified: Secondary | ICD-10-CM

## 2015-10-01 DIAGNOSIS — M5441 Lumbago with sciatica, right side: Secondary | ICD-10-CM | POA: Diagnosis not present

## 2015-10-01 DIAGNOSIS — R531 Weakness: Secondary | ICD-10-CM

## 2015-10-01 DIAGNOSIS — R6889 Other general symptoms and signs: Secondary | ICD-10-CM

## 2015-10-01 DIAGNOSIS — Z7409 Other reduced mobility: Secondary | ICD-10-CM

## 2015-10-01 DIAGNOSIS — M25512 Pain in left shoulder: Secondary | ICD-10-CM

## 2015-10-01 NOTE — Therapy (Signed)
[place], [place], [address] Phone: [phone]   Fax:  [phone]  Physical Therapy Treatment  Patient Details  Name: Kimberly Johnson MRN: [id number] Date of Birth: February 19, 1986 No Data Recorded  Encounter Date: 10/01/2015      PT End of Session - 10/01/15 0807    Visit Number 12   Number of Visits 18   Date for PT Re-Evaluation 10/15/15   PT Start Time 0805   PT Stop Time 0910   PT Time Calculation (min) 65 min   Activity Tolerance Patient tolerated treatment well      No past medical history on file.  No past surgical history on file.  There were no vitals filed for this visit.  Visit Diagnosis:  Right-sided low back pain with right-sided sciatica  Weakness of right leg  Stiffness due to immobility  Decreased strength, endurance, and mobility  Pain in shoulder region, left      Subjective Assessment - 10/01/15 0807    Subjective Pt reports she had been doing pretty well (LB).  Was up to 25 consecutive minutes on treadmill.  Had setback with pain on Thur / Friday and had to scale back routine.   She has had pain in shoulder, but attributes it to her heat being out at house - but she notes her pain has decreased with abduction.    Currently in Pain? Yes   Pain Score 7    Pain Location Back   Pain Orientation Lower   Pain Descriptors / Indicators Tightness   Aggravating Factors  prolonged sitting   Pain Relieving Factors prone press ups.    Multiple Pain Sites Yes   Pain Score 3   Pain Location Leg   Pain Orientation Right   Pain Descriptors / Indicators Pressure   Aggravating Factors  sitting and standing   Pain Relieving Factors laying down            [facility] PT Assessment - 10/01/15 0001    Assessment   Medical Diagnosis LPB/ Lt shoulder pain   Onset Date/Surgical Date 06/06/15  shoulder 09/16   Next MD Visit as needed    AROM   Left Shoulder Flexion 167 Degrees   standing, pain at end range   Left Shoulder ABduction 165 Degrees  standing, painful arc   Left Shoulder Internal Rotation 70 Degrees  supine, no pain   Left Shoulder External Rotation 88 Degrees  supine           [name] - 10/01/15 0001    Self-Care   Self-Care Other Self-Care Comments   Other Self-Care Comments  Instructed pt on self massage to Lt shoulder girdle with ball and how to operate TENS unit that she recently purchases.  Pt was able to return demo of each.    Lumbar Exercises: Stretches   Passive Hamstring Stretch 3 reps;30 seconds  supine with strap   Press Ups 5 reps;10 seconds   ITB Stretch 2 reps;30 seconds   ITB Stretch Limitations (also adductor stretch with strap x 30 sec x 2 each leg)   Piriformis Stretch 2 reps;30 seconds  each leg, supine   Lumbar Exercises: Aerobic   Stationary Bike NuStep L4: 5 min    Knee/Hip Exercises: Standing   Forward Lunges Right;Left;1 set;10 reps   Forward Lunges Limitations tactile cues to correct posterior lean with trunk    Side Lunges Right;Left;1 set;10 reps   Shoulder Exercises: Standing  External Rotation Strengthening;Left;10 reps;Theraband  2 sets   Theraband Level (Shoulder External Rotation) Level 2 (Red)   Internal Rotation Strengthening;Left;10 reps;Theraband   Theraband Level (Shoulder Internal Rotation) Level 2 (Red)   ABduction Strengthening;Left;10 reps;Weights  1#, SCAPTION. Mirror for visual feedback   ABduction Limitations tactile cues for form and scap squeeze   Other Standing Exercises wall wash x 10 reps with LUE.  Repeated in ABD x 5 reps    Other Standing Exercises wall push ups x 10    Modalities   Modalities Electrical Stimulation;Cryotherapy   Cryotherapy   Number Minutes Cryotherapy 15 Minutes   Cryotherapy Location Lumbar Spine   Type of Cryotherapy Ice pack   Electrical Stimulation   Electrical Stimulation Location bilat LB paraspinals and Rt glute   Electrical  Stimulation Action IFC   Electrical Stimulation Parameters to tolerance   Electrical Stimulation Goals Pain            PT Long Term Goals - 10/01/15 1003    PT LONG TERM GOAL #1   Title Patient I in HEP 10/22/15   Time 12   Period Weeks   Status On-going   PT LONG TERM GOAL #2   Title Patient to tolerate sitting for 30-60 min 09/24/15   Time 6   Period Weeks   Status Partially Met  Pt able to sit 30-45 min 10/01/15   PT LONG TERM GOAL #3   Title Full pain free AROM Lt shoulder 10/22/15   Time 6   Period Weeks   Status On-going   PT LONG TERM GOAL #4   Title Improve walking tolerance to 30-45 min 10/22/15   Baseline prior to surgery was walking 45-50 min.    Time 6   Period Weeks   Status On-going  able to walk 25 min - 10/01/15   PT LONG TERM GOAL #5   Title Improve FOTO to </= 48% limitation 10/22/15   Time 6   Period Weeks   Status On-going   PT LONG TERM GOAL #6   Title Pt able to reach up to close/open shower curtain with minimal Lt shoulder pain 10/22/15   Time 6   Period Weeks   Status On-going               Plan - 10/01/15 1004    Clinical Impression Statement Pt tolerated new shoulder exercises with minimal increase in pain. Pt demonstrated improved Lt shoulder ROM.  Pt required mirror and tactile cues for good form and posture.  Pt reporting increased tolerance for sitting and walking, progressing towards those goals.  Pt will benefit from continued PT intervention to increase functional moblility and decrease pain.    Pt will benefit from skilled therapeutic intervention in order to improve on the following deficits Pain;Decreased range of motion;Decreased strength;Improper body mechanics;Abnormal gait;Decreased endurance;Decreased activity tolerance;Obesity   Rehab Potential Good   PT Frequency 1x / week   PT Duration 12 weeks   PT Treatment/Interventions Patient/family education;ADLs/Self Care Home Management;Manual techniques;Therapeutic  exercise;Therapeutic activities;Cryotherapy;Electrical Stimulation;Moist Heat;Ultrasound;Dry needling   PT Next Visit Plan Continue core strengthening/ LE stretching/ extension program;  posture and alignment/position of scapula; progressive Lt shoulder strengthening.    Consulted and Agree with Plan of Care Patient        Problem List There are no active problems to display for this patient.   Kerin Perna, PTA 10/01/2015 10:08 AM  [place], [place], [address]  Phone: [phone]   Fax:  [phone]  Name: Kimberly Johnson MRN: [id number] Date of Birth: [birth year]

## 2015-10-01 NOTE — Patient Instructions (Signed)
Resisted External Rotation: in Neutral - Bilateral    Sit or stand, tubing in both hands, elbows at sides, bent to 90, forearms forward. Pinch shoulder blades together and rotate forearms out. Keep elbows at sides. Repeat _10___ times per set. Do __2__ sets per session. Do _1___ sessions per day.  http://orth.exer.us/967   SHOULDER: Scaption (Band)    Place arm at 45 angle to body. Holding band, raise arm slightly above shoulder, keeping elbow straight. Hold _0__ seconds. Use __yellow______ band. _10__ reps per set, _1-2 sets per day, __3_ days per week  Copyright  VHI. All rights reserved.   ELBOW: Wall Push-Up    With arms slightly wider than shoulders, gently lean body to wall. Push body away from wall by straightening elbows. __10_ reps per set, __2_ sets per day, _3__ days per week Place wedge under hand if wrist range of motion is limited.   Pain relief settings for TENS:  PPS (pulses per second) Hz - 100-150  Pulse duration  50-125 us    (if increase PPS, decrease pulse duration) Play around with settings and use manual to find what gives you best pain relief.   Self massage with ball to back of shoulder

## 2015-10-11 ENCOUNTER — Encounter: Payer: BLUE CROSS/BLUE SHIELD | Admitting: Physical Therapy

## 2015-10-15 ENCOUNTER — Encounter: Payer: BLUE CROSS/BLUE SHIELD | Admitting: Physical Therapy

## 2015-10-17 ENCOUNTER — Ambulatory Visit (INDEPENDENT_AMBULATORY_CARE_PROVIDER_SITE_OTHER): Payer: BLUE CROSS/BLUE SHIELD | Admitting: Physical Therapy

## 2015-10-17 DIAGNOSIS — M623 Immobility syndrome (paraplegic): Secondary | ICD-10-CM

## 2015-10-17 DIAGNOSIS — R531 Weakness: Secondary | ICD-10-CM

## 2015-10-17 DIAGNOSIS — Z7409 Other reduced mobility: Secondary | ICD-10-CM | POA: Diagnosis not present

## 2015-10-17 DIAGNOSIS — R29898 Other symptoms and signs involving the musculoskeletal system: Secondary | ICD-10-CM

## 2015-10-17 DIAGNOSIS — M5441 Lumbago with sciatica, right side: Secondary | ICD-10-CM

## 2015-10-17 DIAGNOSIS — M2560 Stiffness of unspecified joint, not elsewhere classified: Secondary | ICD-10-CM

## 2015-10-17 DIAGNOSIS — R6889 Other general symptoms and signs: Secondary | ICD-10-CM

## 2015-10-17 DIAGNOSIS — M25512 Pain in left shoulder: Secondary | ICD-10-CM

## 2015-10-17 NOTE — Addendum Note (Signed)
Addended by: Val RilesHOLT, Breshae Belcher P on: 10/17/2015 12:09 PM   Modules accepted: Orders

## 2015-10-17 NOTE — Therapy (Addendum)
[place], [place], [address] Phone: [phone]   Fax:  [phone]  Physical Therapy Treatment  Patient Details  Name: Kimberly Johnson MRN: [id number] Date of Birth: [birth year] No Data Recorded  Encounter Date: 10/17/2015      PT End of Session - 10/17/15 0817    Visit Number 13   Number of Visits 18   Date for PT Re-Evaluation 10/15/15   PT Start Time 0807   PT Stop Time 0903   PT Time Calculation (min) 56 min   Activity Tolerance Patient tolerated treatment well      No past medical history on file.  No past surgical history on file.  There were no vitals filed for this visit.  Visit Diagnosis:  Right-sided low back pain with right-sided sciatica  Weakness of right leg  Stiffness due to immobility  Decreased strength, endurance, and mobility  Pain in shoulder region, left      Subjective Assessment - 10/17/15 0813    Subjective Pt had trip last wk to [place]; plane ride ok but conference center had a lot of stairs.  Was in a lot of pain during trip, "but I had a quick recovery".    Pt states her Lt shoulder is feeling much better.   Pt having MRI of low back today.  Is up to walking 20 min on treadmill; hasn't had time to walk longer.    Currently in Pain? Yes   Pain Score 7    Pain Location Back   Pain Orientation Lower   Pain Descriptors / Indicators Tightness;Sharp   Pain Radiating Towards down posterior Rt thigh to calf   Aggravating Factors  stairs, prolonged sitting    Pain Relieving Factors prone press ups             [facility] PT Assessment - 10/17/15 0001    Assessment   Medical Diagnosis LPB/ Lt shoulder pain   Onset Date/Surgical Date 06/06/15  shoulder 09/16   Next MD Visit PRN   ROM / Strength   AROM / PROM / Strength AROM;Strength   AROM   AROM Assessment Site Shoulder;Lumbar   Left Shoulder Flexion 177 Degrees   Left Shoulder ABduction 167 Degrees   Left  Shoulder Internal Rotation 70 Degrees  supine   Left Shoulder External Rotation 95 Degrees  supine   Lumbar Flexion 80%  to shin, pain at mid range in LB   Lumbar Extension WNL   Lumbar - Right Side Bend WNL   Lumbar - Left Side Bend 60%  pain in Rt LB   Lumbar - Right Rotation 80%   Lumbar - Left Rotation 50%  pain in Rt LB   Strength   Right/Left Shoulder Left   Left Shoulder Flexion 5/5  with slight pain    Left Shoulder Extension --  5-/5   Left Shoulder Internal Rotation --  5-/5 with pain in shoulder   Left Shoulder External Rotation 5/5          [name] - 10/17/15 0001    Lumbar Exercises: Stretches   Passive Hamstring Stretch 3 reps;30 seconds  supine with strap   Press Ups 5 reps;10 seconds   Lumbar Exercises: Aerobic   Tread Mill 5 min @ 2.0 mph    Lumbar Exercises: Standing   Forward Lunge 10 reps   Lumbar Exercises: Prone   Opposite Arm/Leg Raise Right arm/Left leg;Left arm/Right leg;10 reps;1 second   Lumbar  Exercises: Quadruped   Madcat/Old Horse 5 reps   Shoulder Exercises: Supine   External Rotation Both;10 reps;Theraband   Theraband Level (Shoulder External Rotation) Level 3 (Green)   ABduction Limitations Sash with red band LUE x 10 reps x 2 sets.    Shoulder Exercises: Standing   External Rotation Both;10 reps;Theraband   Theraband Level (Shoulder External Rotation) Level 3 (Green)   Modalities   Modalities Electrical Stimulation;Cryotherapy   Cryotherapy   Number Minutes Cryotherapy 15 Minutes   Cryotherapy Location Lumbar Spine   Type of Cryotherapy Ice pack   Electrical Stimulation   Electrical Stimulation Location bilat LB paraspinals and Rt glute   Electrical Stimulation Action IFC   Electrical Stimulation Parameters to tolerance    Electrical Stimulation Goals Pain                     PT Long Term Goals - 10/17/15 0849    PT LONG TERM GOAL #1   Title Patient I in HEP 10/22/15   Time 12    Period Weeks   Status On-going   PT LONG TERM GOAL #2   Title Patient to tolerate sitting for 30-60 min 09/24/15   Time 6   Period Weeks   PT LONG TERM GOAL #3   Title Full pain free AROM Lt shoulder 10/22/15   Time 6   Period Weeks   Status Achieved   PT LONG TERM GOAL #4   Title Improve walking tolerance to 30-45 min 10/22/15   Time 6   Period Weeks   Status On-going   PT LONG TERM GOAL #5   Title Improve FOTO to </= 48% limitation 10/22/15   Time 6   Period Weeks   Status On-going   PT LONG TERM GOAL #6   Title Pt able to reach up to close/open shower curtain with minimal Lt shoulder pain 10/22/15   Time 6   Period Weeks   Status Achieved               Plan - 10/17/15 1008    Clinical Impression Statement Pt overall making great progress - Pt reports pain in LB after 3+ hr class 2x/wk; pt issued strategies to assist with decreased pain.  Pt  demonstrated improved lumbar ROM, however still painful in certain directions.  Pt demonstrated improved Lt shoulder strength and ROM; has met LTG 3 and 6.  Pt progressing well towards remaining goals; will benefit from continued PT intervention to maximize functional mobility.     Pt will benefit from skilled therapeutic intervention in order to improve on the following deficits Pain;Decreased range of motion;Decreased strength;Improper body mechanics;Abnormal gait;Decreased endurance;Decreased activity tolerance;Obesity   Rehab Potential Good   PT Frequency 1x / week   PT Duration 12 weeks   PT Treatment/Interventions Patient/family education;ADLs/Self Care Home Management;Manual techniques;Therapeutic exercise;Therapeutic activities;Cryotherapy;Electrical Stimulation;Moist Heat;Ultrasound;Dry needling   PT Next Visit Plan Continue core strengthening/ LE stretching/ extension program;  posture and alignment. FOTO.  Pt has ONE more approved visit through insurance( - will need approval if more visits needed).    PT Home Exercise  Plan Pt to take frequent standing breaks during seated night classes to ease discomfort.    Consulted and Agree with Plan of Care Patient        Problem List There are no active problems to display for this patient.   Kerin Perna, PTA 10/17/2015 10:16 AM  [place]  [place], [place], [address] Phone: [phone]   Fax:  [phone]  Name: Catlin Doria MRN: [id number] Date of Birth: 1986-10-03

## 2015-10-17 NOTE — Patient Instructions (Signed)
Perform internal rotation (pulling in), external rotation (pulling out)- elbows at side with green band.   Sash   On back, knees bent, feet flat, left hand on left hip, right hand above left. Pull right arm DIAGONALLY (hip to shoulder) across chest. Bring right arm along head toward floor. Hold momentarily. Slowly return to starting position. Repeat _10__ times, 2 sets. Do with left, then right arm. Band color __red____    [facility] [address] [place], [place] [address]  [phone] (office) [phone] (fax)

## 2015-10-22 ENCOUNTER — Ambulatory Visit (INDEPENDENT_AMBULATORY_CARE_PROVIDER_SITE_OTHER): Payer: BLUE CROSS/BLUE SHIELD | Admitting: Physical Therapy

## 2015-10-22 DIAGNOSIS — M623 Immobility syndrome (paraplegic): Secondary | ICD-10-CM

## 2015-10-22 DIAGNOSIS — R29898 Other symptoms and signs involving the musculoskeletal system: Secondary | ICD-10-CM | POA: Diagnosis not present

## 2015-10-22 DIAGNOSIS — M2560 Stiffness of unspecified joint, not elsewhere classified: Secondary | ICD-10-CM

## 2015-10-22 DIAGNOSIS — Z7409 Other reduced mobility: Secondary | ICD-10-CM

## 2015-10-22 DIAGNOSIS — M5441 Lumbago with sciatica, right side: Secondary | ICD-10-CM | POA: Diagnosis not present

## 2015-10-22 DIAGNOSIS — M25512 Pain in left shoulder: Secondary | ICD-10-CM

## 2015-10-22 DIAGNOSIS — R531 Weakness: Secondary | ICD-10-CM

## 2015-10-22 NOTE — Therapy (Addendum)
[place], [place], [address] Phone: [phone]   Fax:  [phone]  Physical Therapy Treatment  Patient Details  Name: Kimberly Johnson MRN: [id number] Date of Birth: 1986/09/05 No Data Recorded  Encounter Date: 10/22/2015      PT End of Session - 10/22/15 0811    Visit Number 14   Number of Visits 19  (5 additional visits)   Date for PT Re-Evaluation 11/28/15   PT Start Time 0811  pt arrived late   PT Stop Time 0859   PT Time Calculation (min) 48 min   Activity Tolerance Patient tolerated treatment well      No past medical history on file.  No past surgical history on file.  There were no vitals filed for this visit.  Visit Diagnosis:  Right-sided low back pain with right-sided sciatica  Weakness of right leg  Stiffness due to immobility  Decreased strength, endurance, and mobility  Pain in shoulder region, left      Subjective Assessment - 10/22/15 0815    Subjective Pt reports her pain is a little higher, but she contributes it to sitting longer to complete a 50 page paper. Pt reports she walked 30 min once last wk, some symptoms into leg at end.  Pt desires to continue PT sessions to meet established goals.    Currently in Pain? Yes   Pain Score 6    Pain Location Back   Pain Orientation Lower   Pain Descriptors / Indicators Sore;Tightness   Aggravating Factors  prolonged sitting   Pain Relieving Factors prone press ups             [facility] PT Assessment - 10/22/15 0001    Assessment   Medical Diagnosis LPB/ Lt shoulder pain   Onset Date/Surgical Date 06/06/15   Hand Dominance Right   Next MD Visit PRN   Prior Therapy yes - 06/15 for neck surgery    Observation/Other Assessments   Focus on Therapeutic Outcomes (FOTO)  51% (improved from last intake of 67% and intake at 69%; goal is 48% )   ROM / Strength   AROM / PROM / Strength AROM   AROM   Left Shoulder  Flexion 177 Degrees   Left Shoulder ABduction 167 Degrees   Left Shoulder Internal Rotation 70 Degrees  supine   Left Shoulder External Rotation 95 Degrees  supine   Lumbar Flexion 80%  to shin, pain at mid range in LB   Lumbar Extension WNL   Lumbar - Right Side Bend WNL   Lumbar - Left Side Bend 60%  pain in Rt LB   Lumbar - Right Rotation 80%   Lumbar - Left Rotation 50%  pain in Rt LB          [name] - 10/22/15 0001    Lumbar Exercises: Stretches   Passive Hamstring Stretch 30 seconds;2 reps  supine with strap   Press Ups 5 reps;10 seconds  2 sets   Quadruped Mid Back Stretch 5 reps   ITB Stretch 2 reps;30 seconds   ITB Stretch Limitations (also adductor stretch with strap x 30 sec x 2 each leg)   Lumbar Exercises: Aerobic   Tread Mill 5 min @ 2.1 mph    Lumbar Exercises: Supine   Bridge 15 reps  with legs on green therapy ball   Lumbar Exercises: Prone   Other Prone Lumbar Exercises Prone press x 5 sec hold x 10-  reps, with unilateral knee flexion x 10 each leg. tactile cues to engage correct muscles, VC to engage abdominal to ease discomfort.    Lumbar Exercises: Quadruped   Single Arm Raise Right;Left;5 reps   Straight Leg Raise 10 reps  body on therapy ball   Opposite Arm/Leg Raise Right arm/Left leg;Left arm/Right leg;10 reps  body on therapy ball   Shoulder Exercises: Supine   External Rotation Strengthening;Both;10 reps;Theraband  2 sets   Theraband Level (Shoulder External Rotation) Level 3 (Green)   ABduction Limitations Sash with green band x 10 reps each arm          PT Education - 10/22/15 0839    Education provided Yes   Education Details HEP- added core exercises.    Person(s) Educated Patient   Methods Handout;Explanation   Comprehension Verbalized understanding;Returned demonstration             PT Long Term Goals - 10/17/15 0849    PT LONG TERM GOAL #1   Title Patient I in HEP 11/28/15   Time 12    Period Weeks   Status On-going   PT LONG TERM GOAL #2   Title Patient to tolerate sitting for 30-60 min 11/28/15   Time 6   Period Weeks   Status Partially Met   PT LONG TERM GOAL #3   Title Full pain free AROM Lt shoulder 10/22/15   Time 6   Period Weeks   Status Achieved   PT LONG TERM GOAL #4   Title Improve walking tolerance to 30-45 min 11/28/15   Time 6   Period Weeks   Status On-going   PT LONG TERM GOAL #5   Title Improve FOTO to </= 48% limitation 11/28/15   Time 6   Period Weeks   Status On-going   PT LONG TERM GOAL #6   Title Pt able to reach up to close/open shower curtain with minimal Lt shoulder pain 10/22/15   Time 6   Period Weeks   Status Achieved               Plan - 10/22/15 1005    Clinical Impression Statement Pt has demonstrated improved Lumbar ROM, yet painful in Lt side bend and rotation.  Pt tolerated increased resistance with shoulder exercises and has met her shoulder related goals; pt ready to continue HEP for shoulder at home. Pt tolerated new core exercises with minimal increase in pain.  Pt is making great progress overall towards remaining goals.  Pt interested  in and would benefit from continued PT intervention for LB to meet established goals.   PT requesting 5 additional visits to maximize rehab potential.    Pt will benefit from skilled therapeutic intervention in order to improve on the following deficits Pain;Decreased range of motion;Decreased strength;Improper body mechanics;Abnormal gait;Decreased endurance;Decreased activity tolerance;Obesity   Rehab Potential Good   PT Frequency 1x / week   PT Duration 12 weeks   PT Treatment/Interventions Patient/family education;ADLs/Self Care Home Management;Manual techniques;Therapeutic exercise;Therapeutic activities;Cryotherapy;Electrical Stimulation;Moist Heat;Ultrasound;Dry needling   PT Next Visit Plan Continue core strengthening/ LE stretching/ extension program;  posture and alignment.     Consulted and Agree with Plan of Care Patient        Problem List There are no active problems to display for this patient.  Kimberly Johnson, PTA 10/22/2015 4:42 PM  [facility] [place], [place], [address] Phone: [phone]   Fax:  [phone]  Name: Kimberly  Johnson MRN: [id number] Date of Birth: 03-05-1986

## 2015-10-22 NOTE — Patient Instructions (Addendum)
Bird Dog Pose - Intermediate    Keep pelvis stable. From table pose, step one leg back to plank pose. Reach opposite arm forward, back foot off floor.  Hold for __1__ breaths. Return arm and leg at same rate. Repeat _10___ times, alternating sides.  Bridge    Lift hips, keeping pelvis level. Do __10_ times, _2__ sets.   Pelvic Press  Series   Place hands under belly between navel and pubic bone, palms up. Feel pressure on hands. Increase pressure on hands by pressing pelvis down. This is NOT a pelvic tilt. Hold __5_ seconds. Relax. Repeat _10__ times. Once a day.  KNEE: Flexion - Prone   Hold pelvic press. Bend knee. Raise heel toward buttocks. Repeat on opposite leg. Do not raise hips. _10__ reps per set. When this is mastered, pull both heels up at same time, x 10 reps.  Once a day   Hip Extension (Prone)  Hold pelvic press  Lift left leg _3___ inches from floor, keeping knee locked. Repeat __10__ times per set. Do _1___ sets per session. Do _10___ sessions per day.  Axial Extension- Upper body sequence * always start with pelvic press    Lie on stomach with forehead resting on floor and arms at sides. Tuck chin in and raise head from floor without bending it up or down. Repeat ___10_ times per set. Do __1__ sets per session. Do _1___ sessions per day.   [facility] [address] [place], [place] [address]  [phone] (office) [phone] (fax)

## 2015-10-29 ENCOUNTER — Encounter: Payer: BLUE CROSS/BLUE SHIELD | Admitting: Physical Therapy

## 2015-11-02 ENCOUNTER — Encounter: Payer: BLUE CROSS/BLUE SHIELD | Admitting: Rehabilitative and Restorative Service Providers"

## 2015-11-07 ENCOUNTER — Ambulatory Visit (INDEPENDENT_AMBULATORY_CARE_PROVIDER_SITE_OTHER): Payer: BLUE CROSS/BLUE SHIELD | Admitting: Physical Therapy

## 2015-11-07 DIAGNOSIS — R29898 Other symptoms and signs involving the musculoskeletal system: Secondary | ICD-10-CM

## 2015-11-07 DIAGNOSIS — M623 Immobility syndrome (paraplegic): Secondary | ICD-10-CM

## 2015-11-07 DIAGNOSIS — M5441 Lumbago with sciatica, right side: Secondary | ICD-10-CM

## 2015-11-07 DIAGNOSIS — Z7409 Other reduced mobility: Secondary | ICD-10-CM

## 2015-11-07 DIAGNOSIS — M2560 Stiffness of unspecified joint, not elsewhere classified: Secondary | ICD-10-CM

## 2015-11-07 NOTE — Therapy (Signed)
[place], [place], [address] Phone: [phone]   Fax:  [phone]  Physical Therapy Treatment  Patient Details  Name: Kimberly Johnson MRN: [id number] Date of Birth: [birth year] No Data Recorded  Encounter Date: 11/07/2015      PT End of Session - 11/07/15 0845    Visit Number 15   Number of Visits 19   Date for PT Re-Evaluation 11/28/15   PT Start Time 0803   PT Stop Time 6962   PT Time Calculation (min) 56 min      No past medical history on file.  No past surgical history on file.  There were no vitals filed for this visit.  Visit Diagnosis:  Right-sided low back pain with right-sided sciatica  Stiffness due to immobility  Weakness of right leg      Subjective Assessment - 11/07/15 0805    Subjective Pt reports she was sick last wk with stomach virus. Only performed LE stretches, no other exercises. Pt reports she has been feeling pretty good otherwise; has not been sitting long periods for studying or class. Returns to school Jan 10.  Has been released by surgeon to do yoga, eliptical, biking.    Currently in Pain? Yes   Pain Score 4    Pain Location Back   Pain Orientation Right;Lower   Pain Descriptors / Indicators Sore   Aggravating Factors  prolonged sitting   Pain Relieving Factors prone press ups, stretches            [facility] PT Assessment - 11/07/15 0001    Assessment   Medical Diagnosis LPB/ Lt shoulder pain   Onset Date/Surgical Date 06/06/15   Hand Dominance Right   Next MD Visit PRN          [name] - 11/07/15 0001    Lumbar Exercises: Stretches   Passive Hamstring Stretch 30 seconds;2 reps  supine with strap   Press Ups 5 reps;10 seconds  2 sets   ITB Stretch 2 reps;30 seconds   ITB Stretch Limitations (also adductor stretch with strap x 30 sec x 2 each leg)   Lumbar Exercises: Aerobic   Tread Mill 5 min @ 2.1 mph    Lumbar  Exercises: Prone   Opposite Arm/Leg Raise Right arm/Left leg;Left arm/Right leg;10 reps;1 second   Lumbar Exercises: Quadruped   Opposite Arm/Leg Raise Left arm/Right leg;5 reps  back began to hurt, stopped   Knee/Hip Exercises: Standing   Hip Abduction Right;Left;1 set;10 reps;Knee straight  core engaged, red band at ankle   Hip Extension Right;1 set;10 reps;Knee straight;Left  core engaged, red band at ankle   Knee/Hip Exercises: Supine   Bridges Limitations Bridges with feet on green ball x 10; bridge with hamstring curl x 5 reps (very challenging)   Modalities   Modalities Electrical Stimulation;Cryotherapy   Cryotherapy   Number Minutes Cryotherapy 15 Minutes   Cryotherapy Location Lumbar Spine   Type of Cryotherapy Ice pack   Electrical Stimulation   Electrical Stimulation Location bilat LB paraspinals and Rt glute   Electrical Stimulation Action IFC   Electrical Stimulation Parameters to tolerance   Electrical Stimulation Goals Pain                     PT Long Term Goals - 10/17/15 0849    PT LONG TERM GOAL #1   Title Patient I in HEP 11/28/15   Time 12   Period Weeks  Status On-going   PT LONG TERM GOAL #2   Title Patient to tolerate sitting for 30-60 min 11/28/15   Time 6   Period Weeks   Status Partially Met   PT LONG TERM GOAL #3   Title Full pain free AROM Lt shoulder 10/22/15   Time 6   Period Weeks   Status Achieved   PT LONG TERM GOAL #4   Title Improve walking tolerance to 30-45 min 11/28/15   Time 6   Period Weeks   Status On-going   PT LONG TERM GOAL #5   Title Improve FOTO to </= 48% limitation 11/28/15   Time 6   Period Weeks   Status On-going   PT LONG TERM GOAL #6   Title Pt able to reach up to close/open shower curtain with minimal Lt shoulder pain 10/22/15   Time 6   Period Weeks   Status Achieved               Plan - 11/07/15 1719    Clinical Impression Statement Pt tolerated core exercises with increased  difficulty with minimal pain in LB. Pt did report slight increase in LBP with hamstring curl (legs on ball with bridge); encouraged to limit this exercise in HEP until stronger. Pain reduced at end of session with use of ice and estim.  Pt had slight set back with HEP due to stomach virus.  Gradually progressing towards remaining goals.    Pt will benefit from skilled therapeutic intervention in order to improve on the following deficits Pain;Decreased range of motion;Decreased strength;Improper body mechanics;Abnormal gait;Decreased endurance;Decreased activity tolerance;Obesity   Rehab Potential Good   PT Frequency 1x / week   PT Duration 12 weeks   PT Treatment/Interventions Patient/family education;ADLs/Self Care Home Management;Manual techniques;Therapeutic exercise;Therapeutic activities;Cryotherapy;Electrical Stimulation;Moist Heat;Ultrasound;Dry needling   PT Next Visit Plan Continue core strengthening/ LE stretching/ extension program;  posture and alignment.    Consulted and Agree with Plan of Care Patient        Problem List There are no active problems to display for this patient.   Kerin Perna, PTA 11/07/2015 5:22 PM  [place], [place], [address] Phone: [phone]   Fax:  [phone]  Name: Cherryl Babin MRN: [id number] Date of Birth: May 31, 1986

## 2015-11-07 NOTE — Patient Instructions (Signed)
HIP / KNEE: Extension - Standing (Band)  ABDUCTION: Standing - Resistance Band (Active)    Place band around leg. Squeeze glutes. Raise and lift leg backward. Keep knee straight. Hold __1_ seconds. Use __red______ band. _10__ reps per set, _1-2__ sets per day, _3__ days per week Hold onto a support.    Copyright  VHI. All rights reserved.    Stand, feet flat. Against red resistance band, lift right/left leg out to side. Complete _1__ sets of _10__ repetitions. Work up to 2 sets of 10. Perform __1_ sessions per day.  http://gtsc.exer.us/117   Copyright  VHI. All rights reserved.

## 2015-11-14 ENCOUNTER — Ambulatory Visit (INDEPENDENT_AMBULATORY_CARE_PROVIDER_SITE_OTHER): Payer: BLUE CROSS/BLUE SHIELD | Admitting: Physical Therapy

## 2015-11-14 DIAGNOSIS — R6889 Other general symptoms and signs: Secondary | ICD-10-CM

## 2015-11-14 DIAGNOSIS — M5441 Lumbago with sciatica, right side: Secondary | ICD-10-CM | POA: Diagnosis not present

## 2015-11-14 DIAGNOSIS — R29898 Other symptoms and signs involving the musculoskeletal system: Secondary | ICD-10-CM | POA: Diagnosis not present

## 2015-11-14 DIAGNOSIS — R531 Weakness: Secondary | ICD-10-CM

## 2015-11-14 DIAGNOSIS — Z7409 Other reduced mobility: Secondary | ICD-10-CM

## 2015-11-14 DIAGNOSIS — M623 Immobility syndrome (paraplegic): Secondary | ICD-10-CM | POA: Diagnosis not present

## 2015-11-14 DIAGNOSIS — M2560 Stiffness of unspecified joint, not elsewhere classified: Secondary | ICD-10-CM

## 2015-11-14 NOTE — Therapy (Addendum)
[place], [place], [address] Phone: [phone]   Fax:  [phone]  Physical Therapy Treatment  Patient Details  Name: Kimberly Johnson MRN: [id number] Date of Birth: 05-07-1986 No Data Recorded  Encounter Date: 11/14/2015      PT End of Session - 11/14/15 0811    Visit Number 16   Number of Visits 19   Date for PT Re-Evaluation 11/28/15   PT Start Time 8115  Pt arrived late.   PT Stop Time 0904   PT Time Calculation (min) 54 min   Activity Tolerance Patient tolerated treatment well      No past medical history on file.  No past surgical history on file.  There were no vitals filed for this visit.  Visit Diagnosis:  Right-sided low back pain with right-sided sciatica  Stiffness due to immobility  Weakness of right leg  Decreased strength, endurance, and mobility      Subjective Assessment - 11/14/15 0811    Subjective Pt returned to work yesterday.  Walked yesterday for 30 min; had some increased radicular symptoms afterward.  Back had increased soreness after last visit, decreased HEP to just stretches for a few days.    Currently in Pain? Yes   Pain Score 5    Pain Location Back   Pain Orientation Right;Lower   Pain Descriptors / Indicators Sore   Pain Radiating Towards Rt thigh to ankle   Aggravating Factors  prolonged sitting   Pain Relieving Factors prone press ups, stretches.             [facility] PT Assessment - 11/14/15 0001    Assessment   Medical Diagnosis LPB/ Lt shoulder pain   Onset Date/Surgical Date 06/06/15   Hand Dominance Right   Next MD Visit PRN           [name] - 11/14/15 0001    Lumbar Exercises: Stretches   Passive Hamstring Stretch 30 seconds;2 reps  supine with strap   Press Ups 5 reps;10 seconds  2 sets   ITB Stretch 2 reps;30 seconds   ITB Stretch Limitations (also adductor stretch with strap x 30 sec x 2 each  leg)   Lumbar Exercises: Aerobic   Tread Mill 5 min @ 2.2 mph    Lumbar Exercises: Standing   Forward Lunge 10 reps  each leg   Side Lunge 10 reps  each leg; with slight forward trunk lean   Lumbar Exercises: Quadruped   Opposite Arm/Leg Raise Right arm/Left leg;Left arm/Right leg;10 reps  on top of therapy ball    Knee/Hip Exercises: Seated   Other Seated Knee/Hip Exercises Seated on green ball:  core engaged (arms across chest) gentle backward leans (dynamic stabilization) x 5 reps; repeated with arms reciprocal knee tap x 5 each; repeated with arms horizontal abd/add x 5 reps each side.   Attempted seated marching on ball, unable.  Moved to solid surface and repeated x 10 reps (very challenging to hip flex Rt)    Modalities   Modalities Electrical Stimulation;Cryotherapy   Cryotherapy   Number Minutes Cryotherapy 15 Minutes   Cryotherapy Location Lumbar Spine   Type of Cryotherapy Ice pack   Electrical Stimulation   Electrical Stimulation Location bilat LB paraspinals and Rt glute   Electrical Stimulation Action IFC   Electrical Stimulation Parameters to tolerance x 15 min    Electrical Stimulation Goals Pain      Forward leans to chair seat  x 8 reps each side.  Challenging.            PT Education - 11/14/15 0919    Education provided Yes   Education Details HEP- added dynamic stability core exercises (seated on ball with slight backward lean) and beginner forward leans.     Person(s) Educated Patient   Methods Handout;Demonstration;Explanation   Comprehension Verbalized understanding;Returned demonstration             PT Long Term Goals - 10/17/15 0849    PT LONG TERM GOAL #1   Title Patient I in HEP 11/28/15   Time 12   Period Weeks   Status On-going   PT LONG TERM GOAL #2   Title Patient to tolerate sitting for 30-60 min 11/28/15   Time 6   Period Weeks   Status Partially Met   PT LONG TERM GOAL #3   Title Full pain free AROM Lt shoulder 10/22/15    Time 6   Period Weeks   Status Achieved   PT LONG TERM GOAL #4   Title Improve walking tolerance to 30-45 min 11/28/15   Time 6   Period Weeks   Status On-going   PT LONG TERM GOAL #5   Title Improve FOTO to </= 48% limitation 11/28/15   Time 6   Period Weeks   Status On-going   PT LONG TERM GOAL #6   Title Pt able to reach up to close/open shower curtain with minimal Lt shoulder pain 10/22/15   Time 6   Period Weeks   Status Achieved               Plan - 11/14/15 4680    Clinical Impression Statement Rt SLS activities challenging. Pt  tolerated new exercises with minimal/no increase in LBP.  Pt found seated marching difficult with RLE due to weakness.  Pt reported decreased LBP with use of ice/estim at end of session. Making great progress towards remaining goals with improved tolerance for walking.     Pt will benefit from skilled therapeutic intervention in order to improve on the following deficits Pain;Decreased range of motion;Decreased strength;Improper body mechanics;Abnormal gait;Decreased endurance;Decreased activity tolerance;Obesity   Rehab Potential Good   PT Frequency 1x / week   PT Duration 12 weeks   PT Treatment/Interventions Patient/family education;ADLs/Self Care Home Management;Manual techniques;Therapeutic exercise;Therapeutic activities;Cryotherapy;Electrical Stimulation;Moist Heat;Ultrasound;Dry needling   PT Next Visit Plan Continue core strengthening/ LE stretching/ extension program;  posture and alignment.    Consulted and Agree with Plan of Care Patient        Problem List There are no active problems to display for this patient.   Kerin Perna, PTA 11/14/2015 9:20 AM  [name], [place], [address] Phone: [phone]   Fax:  [phone]  Name: Kimberly Johnson MRN: [id number] Date of Birth: 01-Feb-1986   PHYSICAL THERAPY DISCHARGE SUMMARY  Visits  from Start of Care: 16  Current functional level related to goals / functional outcomes: Gradual improvement in symptoms through course of therapy treatment. Patient continues to work on I HEP and has gradually increased activity level.    Remaining deficits: Intermittent symptoms   Education / Equipment: HEP; theraband Plan: Patient agrees to discharge.  Patient goals were met. Patient is being discharged due to meeting the stated rehab goals.  ?????   Celyn P. Helene Kelp PT, MPH 12/18/2015 8:33 AM

## 2015-11-21 ENCOUNTER — Encounter: Payer: BLUE CROSS/BLUE SHIELD | Admitting: Physical Therapy
